# Patient Record
Sex: Female | Born: 1980 | Race: Asian | Hispanic: No | Marital: Married | Smoking: Never smoker
Health system: Southern US, Community
[De-identification: ages and names within clinical notes are randomized; demographics above are authoritative.]

---

## 2020-06-05 ENCOUNTER — Encounter (HOSPITAL_COMMUNITY): Payer: Self-pay

## 2020-06-05 ENCOUNTER — Other Ambulatory Visit: Payer: Self-pay

## 2020-06-05 ENCOUNTER — Ambulatory Visit (HOSPITAL_COMMUNITY)
Admission: EM | Admit: 2020-06-05 | Discharge: 2020-06-05 | Disposition: A | Discharge status: Home / Self Care | Payer: Self-pay | Attending: Urgent Care | Admitting: Urgent Care

## 2020-06-05 DIAGNOSIS — R109 Unspecified abdominal pain: Secondary | ICD-10-CM | POA: Insufficient documentation

## 2020-06-05 LAB — CBC WITH DIFFERENTIAL/PLATELET
Abs Immature Granulocytes: 0.02 10*3/uL (ref 0.00–0.07)
Basophils Absolute: 0 10*3/uL (ref 0.0–0.1)
Basophils Relative: 0 %
Eosinophils Absolute: 0.2 10*3/uL (ref 0.0–0.5)
Eosinophils Relative: 2 %
HCT: 35.8 % — ABNORMAL LOW (ref 36.0–46.0)
Hemoglobin: 11.9 g/dL — ABNORMAL LOW (ref 12.0–15.0)
Immature Granulocytes: 0 %
Lymphocytes Relative: 37 %
Lymphs Abs: 2.6 10*3/uL (ref 0.7–4.0)
MCH: 27.8 pg (ref 26.0–34.0)
MCHC: 33.2 g/dL (ref 30.0–36.0)
MCV: 83.6 fL (ref 80.0–100.0)
Monocytes Absolute: 0.5 10*3/uL (ref 0.1–1.0)
Monocytes Relative: 7 %
Neutro Abs: 3.8 10*3/uL (ref 1.7–7.7)
Neutrophils Relative %: 54 %
Platelets: 246 10*3/uL (ref 150–400)
RBC: 4.28 MIL/uL (ref 3.87–5.11)
RDW: 14.6 % (ref 11.5–15.5)
WBC: 7.1 10*3/uL (ref 4.0–10.5)
nRBC: 0 % (ref 0.0–0.2)

## 2020-06-05 LAB — POCT URINALYSIS DIPSTICK, ED / UC
Bilirubin Urine: NEGATIVE
Glucose, UA: NEGATIVE mg/dL
Hgb urine dipstick: NEGATIVE
Ketones, ur: NEGATIVE mg/dL
Leukocytes,Ua: NEGATIVE
Nitrite: NEGATIVE
Protein, ur: NEGATIVE mg/dL
Specific Gravity, Urine: 1.025 (ref 1.005–1.030)
Urobilinogen, UA: 0.2 mg/dL (ref 0.0–1.0)
pH: 6 (ref 5.0–8.0)

## 2020-06-05 LAB — COMPREHENSIVE METABOLIC PANEL
ALT: 10 U/L (ref 0–44)
AST: 15 U/L (ref 15–41)
Albumin: 3 g/dL — ABNORMAL LOW (ref 3.5–5.0)
Alkaline Phosphatase: 87 U/L (ref 38–126)
Anion gap: 8 (ref 5–15)
BUN: 11 mg/dL (ref 6–20)
CO2: 25 mmol/L (ref 22–32)
Calcium: 8.9 mg/dL (ref 8.9–10.3)
Chloride: 105 mmol/L (ref 98–111)
Creatinine, Ser: 0.77 mg/dL (ref 0.44–1.00)
GFR, Estimated: >60 mL/min (ref >60)
Glucose, Bld: 100 mg/dL — ABNORMAL HIGH (ref 70–99)
Potassium: 3.9 mmol/L (ref 3.5–5.1)
Sodium: 138 mmol/L (ref 135–145)
Total Bilirubin: 0.6 mg/dL (ref 0.3–1.2)
Total Protein: 7.1 g/dL (ref 6.5–8.1)

## 2020-06-05 LAB — LIPASE, BLOOD: Lipase: 31 U/L (ref 11–51)

## 2020-06-05 MED ORDER — NAPROXEN 500 MG PO TABS: 500.0000 mg | ORAL_TABLET | Freq: Two times a day (BID) | ORAL | 0 refills | Status: DC

## 2020-06-05 MED ORDER — OMEPRAZOLE 20 MG PO CPDR: 20.0000 mg | DELAYED_RELEASE_CAPSULE | Freq: Every day | ORAL | 0 refills | Status: DC

## 2020-06-05 NOTE — ED Provider Notes (Signed)
[facility]    CSN: [id number] Arrival date & time: 06/05/20  1853      History   Chief Complaint Chief Complaint  Patient presents with  . Cough  . Headache  . Abdominal Pain    HPI Jacqueline Sullivan is a 40 y.o. female.   Jacqueline Sullivan presents with complaints of left mid abdomen/ flank pain which has been ongoing for the past two days. She has had this intermittently for 2 years now. She moved here 1 [place] ago from [place], her physician there told her she was "inflamed" and provided a medication which would treat the pain. She doesn't know what the medication is. No nausea vomiting diarrhea or constipation. Subjective fevers. Some cough. No sore throat. No ear pain. No other body aches. No urinary symptoms. No skin rash. She doesn't work outside of the home. The pain is constant. No known triggers. Occasional heart burn. She has tried ibuprofen which hasn't helped with pain.    Family with patient providing interpretation per patient request.    ROS per HPI, negative if not otherwise mentioned.      History reviewed. No pertinent past medical history.  There are no problems to display for this patient.   History reviewed. No pertinent surgical history.  OB History   No obstetric history on file.      Home Medications    Prior to Admission medications   Medication Sig Start Date End Date Taking? Authorizing Provider  naproxen (NAPROSYN) 500 MG tablet Take 1 tablet (500 mg total) by mouth 2 (two) times daily. 06/05/20  Yes Linus Mako B, NP  omeprazole (PRILOSEC) 20 MG capsule Take 1 capsule (20 mg total) by mouth daily. 06/05/20  Yes Georgetta Haber, NP    Family History Family History  Problem Relation Age of Onset  . Hypertension Mother     Social History Social History   Tobacco Use  . Smoking status: Never Smoker  . Smokeless tobacco: Never Used  Substance Use Topics  . Alcohol use: Never  . Drug use: Never      Allergies   Patient has no known allergies.   Review of Systems Review of Systems   Physical Exam Triage Vital Signs ED Triage Vitals  Enc Vitals Group     BP 06/05/20 1921 (!) 127/94     Pulse Rate 06/05/20 1919 85     Resp 06/05/20 1919 18     Temp 06/05/20 1921 98.9 F (37.2 C)     Temp Source 06/05/20 1919 Oral     SpO2 06/05/20 1919 100 %     Weight --      Height --      Head Circumference --      Peak Flow --      Pain Score 06/05/20 1915 6     Pain Loc --      Pain Edu? --      Excl. in GC? --    No data found.  Updated Vital Signs BP (!) 127/94 (BP Location: Right Arm)   Pulse 85   Temp 98.9 F (37.2 C) (Oral)   Resp 18   LMP 05/27/2020 (Approximate)   SpO2 100%   Visual Acuity Right Eye Distance:   Left Eye Distance:   Bilateral Distance:    Right Eye Near:   Left Eye Near:    Bilateral Near:     Physical Exam Constitutional:      General: She is not  in acute distress.    Appearance: She is well-developed.  Cardiovascular:     Rate and Rhythm: Normal rate.  Pulmonary:     Effort: Pulmonary effort is normal.  Abdominal:       Comments: Left lateral mid abdomen with tenderness, no LUQ tenderness or LLQ tenderness; no visible rash. No CVA tenderness   Skin:    General: Skin is warm and dry.  Neurological:     Mental Status: She is alert and oriented to person, place, and time.      UC Treatments / Results  Labs (all labs ordered are listed, but only abnormal results are displayed) Labs Reviewed  CBC WITH DIFFERENTIAL/PLATELET  COMPREHENSIVE METABOLIC PANEL  LIPASE, BLOOD  POCT URINALYSIS DIPSTICK, ED / UC    EKG   Radiology No results found.  Procedures Procedures (including critical care time)  Medications Ordered in UC Medications - No data to display  Initial Impression / Assessment and Plan / UC Course  I have reviewed the triage vital signs and the nursing notes.  Pertinent labs & imaging results that  were available during my care of the patient were reviewed by me and considered in my medical decision making (see chart for details).     ua normal. Affected area is more muscular than any underlying organ. No shortness of breath . Vitals stable here. Labs including lipase collected as baseline and per patient requests. nsaids and ppi provided, encouraged to establish with a PCP for recheck and to establish care. Patient verbalized understanding and agreeable to plan.   Final Clinical Impressions(s) / UC Diagnoses   Final diagnoses:  Left flank pain     Discharge Instructions     I will call you if there are any concerning finding with your lab tests.  Naproxen twice a day, take with food, to help with pain. Don't take additional ibuprofen.  Omeprazole daily before meals.  Please establish with a primary care provider as you may need further evaluation if symptoms persist.    ED Prescriptions    Medication Sig Dispense Auth. Provider   naproxen (NAPROSYN) 500 MG tablet Take 1 tablet (500 mg total) by mouth 2 (two) times daily. 30 tablet Linus Mako B, NP   omeprazole (PRILOSEC) 20 MG capsule Take 1 capsule (20 mg total) by mouth daily. 30 capsule Georgetta Haber, NP     PDMP not reviewed this encounter.   Georgetta Haber, NP 06/05/20 2006

## 2020-06-05 NOTE — ED Triage Notes (Signed)
Pt presents with back pain, LLQ pain X 2 years. Pt states she had a PCP in her country. She states she has not had a follow up. Pt has also been having a cough, headache, fever X 2 days. Pt states she has been getting chills.

## 2020-06-05 NOTE — Discharge Instructions (Signed)
I will call you if there are any concerning finding with your lab tests.  Naproxen twice a day, take with food, to help with pain. Don't take additional ibuprofen.  Omeprazole daily before meals.  Please establish with a primary care provider as you may need further evaluation if symptoms persist.

## 2020-06-17 ENCOUNTER — Emergency Department (HOSPITAL_COMMUNITY)
Admission: EM | Admit: 2020-06-17 | Discharge: 2020-06-18 | Disposition: A | Discharge status: Home / Self Care | Payer: Self-pay | Attending: Emergency Medicine | Admitting: Emergency Medicine

## 2020-06-17 ENCOUNTER — Encounter (HOSPITAL_COMMUNITY): Payer: Self-pay

## 2020-06-17 DIAGNOSIS — R1032 Left lower quadrant pain: Secondary | ICD-10-CM | POA: Insufficient documentation

## 2020-06-17 DIAGNOSIS — R109 Unspecified abdominal pain: Secondary | ICD-10-CM

## 2020-06-17 LAB — URINALYSIS, ROUTINE W REFLEX MICROSCOPIC
Bilirubin Urine: NEGATIVE
Glucose, UA: NEGATIVE mg/dL
Hgb urine dipstick: NEGATIVE
Ketones, ur: NEGATIVE mg/dL
Leukocytes,Ua: NEGATIVE
Nitrite: NEGATIVE
Protein, ur: NEGATIVE mg/dL
Specific Gravity, Urine: 1.019 (ref 1.005–1.030)
pH: 5 (ref 5.0–8.0)

## 2020-06-17 NOTE — ED Triage Notes (Signed)
Pt reports that she has L sided flank pain and dysuria for the past few days

## 2020-06-18 ENCOUNTER — Encounter (HOSPITAL_COMMUNITY): Payer: Self-pay | Admitting: Emergency Medicine

## 2020-06-18 MED ORDER — OXYCODONE-ACETAMINOPHEN 5-325 MG PO TABS
1.0000 | ORAL_TABLET | Freq: Once | ORAL | Status: AC
  Administered 2020-06-18: 1 via ORAL
  Filled 2020-06-18: qty 1

## 2020-06-18 NOTE — Progress Notes (Signed)
   06/18/20 0834  TOC ED Mini Assessment  TOC Time spent with patient (minutes): 30  TOC Time saved using PING (minutes): 15  PING Used in TOC Assessment Yes  Admission or Readmission Diverted Yes  Interventions which prevented an admission or readmission Follow-up medical appointment  What brought you to the Emergency Department?  pain  Barriers to Discharge ED Uninsured needing PCP establishment  Barrier interventions appointment at [facility]  Means of departure Car    Kawika Bischoff J. Lucretia Roers, RN, BSN, [place] [phone]  RNCM set up appointment with [facility] on 3/24 @ 0930.  Placed appointment information on After Visit Summary (AVS) advised to please arrive 15 min early and take a picture ID and your current medications.

## 2020-06-18 NOTE — ED Provider Notes (Signed)
[facility] EMERGENCY DEPARTMENT Provider Note   CSN: [id number] Arrival date & time: 06/17/20  1929     History Chief Complaint  Patient presents with  . Flank Pain    Jacqueline Sullivan is a 40 y.o. female.  40 y.o female with a PMH of UC presents to the ED with a chief complaint of left abdominal pain x 1 year. Patient arrived from [place] about a month ago and has not been able to establish PCP care due to financial strain.  Patient was evaluated treated with omeprazole with likely thought to be musculoskeletal pain.  She reports she was diagnosed with ulcerative colitis while in [place], she has been taking the medication which she does not recall the name which was helping with her symptoms, after this medication ran out her symptoms returned. She does report the pain has been ongoing goes from her left side radiating to her back, there is no alleviating or exacerbating factors. Does report waking up "very cold and going to bed feeling very hot". She denies any nausea, vomiting, fever, vaginal bleeding or discharge. Does endorses some burning with urination but no hematuria. No chest pain, no shortness of breath, no other complaints.   The history is provided by the patient. A language interpreter was used (Riem [phone]).         OB History   No obstetric history on file.     Family History  Problem Relation Age of Onset  . Hypertension Mother     Social History   Tobacco Use  . Smoking status: Never Smoker  . Smokeless tobacco: Never Used  Substance Use Topics  . Alcohol use: Never  . Drug use: Never    Home Medications Prior to Admission medications   Medication Sig Start Date End Date Taking? Authorizing Provider  naproxen (NAPROSYN) 500 MG tablet Take 1 tablet (500 mg total) by mouth 2 (two) times daily. 06/05/20   Georgetta Haber, NP  omeprazole (PRILOSEC) 20 MG capsule Take 1 capsule (20 mg total) by mouth daily. 06/05/20   Georgetta Haber,  NP    Allergies    Patient has no known allergies.  Review of Systems   Review of Systems  Constitutional: Positive for chills. Negative for fever.  Respiratory: Negative for shortness of breath.   Cardiovascular: Negative for chest pain and leg swelling.  Gastrointestinal: Positive for abdominal pain. Negative for nausea and vomiting.  Genitourinary: Positive for flank pain.  Musculoskeletal: Negative for back pain.  Skin: Negative for pallor and wound.  Neurological: Negative for light-headedness, numbness and headaches.  All other systems reviewed and are negative.   Physical Exam Updated Vital Signs BP (!) 146/96 (BP Location: Left Arm)   Pulse 60   Temp 98.5 F (36.9 C) (Oral)   Resp 16   LMP 05/27/2020 (Approximate)   SpO2 100%   Physical Exam Vitals and nursing note reviewed.  Constitutional:      Comments: Non ill, non toxic appearing.   HENT:     Head: Normocephalic and atraumatic.     Mouth/Throat:     Mouth: Mucous membranes are moist.  Cardiovascular:     Rate and Rhythm: Normal rate.  Pulmonary:     Effort: Pulmonary effort is normal.     Breath sounds: No wheezing or rales.  Abdominal:     General: Abdomen is flat.     Palpations: Abdomen is soft.     Tenderness: There is no abdominal tenderness. There  is no right CVA tenderness or left CVA tenderness.     Comments: Bowel sounds present, soft, non tender abdomen.   Musculoskeletal:        General: No tenderness, deformity or signs of injury.     Cervical back: Normal range of motion and neck supple.  Skin:    General: Skin is warm and dry.  Neurological:     Mental Status: She is alert and oriented to person, place, and time.     ED Results / Procedures / Treatments   Labs (all labs ordered are listed, but only abnormal results are displayed) Labs Reviewed  URINALYSIS, ROUTINE W REFLEX MICROSCOPIC  POC URINE PREG, ED    EKG None  Radiology No results found.  Procedures Procedures    Medications Ordered in ED Medications  oxyCODONE-acetaminophen (PERCOCET/ROXICET) 5-325 MG per tablet 1 tablet (has no administration in time range)    ED Course  I have reviewed the triage vital signs and the nursing notes.  Pertinent labs & imaging results that were available during my care of the patient were reviewed by me and considered in my medical decision making (see chart for details).    MDM Rules/Calculators/A&P     Language line utilized for this encounter Riem [phone]   Patient here with left flank pain reports she arrived here from [place] about a month ago, was treated for ulcerative colitis while there, was taken a medication which recall the name of the medication but was medication ran out she returned symptoms worsen.  She was evaluated at urgent care, prescription for naproxen, omeprazole thought that this was likely being caused by symptoms.  She does report there urinary symptoms at this time, such as dysuria.   During primary evaluation, patient is nontoxic, nonill stretcher in the hallway.  She denies any nausea, vomiting, fevers.  She does report hot flashes while in bed.  We discussed appropriate follow-up with primary care in order to obtain a ulcerative colitis treatment.  I placed a call for social work in order to have patient followed by them along with schedule an appointment with the [facility].  Her abdomen is soft, nontender to palpation, there is no nausea, vomiting, vitals are stable.  This was discussed at length with patient along with interpreter.  Patient was able to have an appointment scheduled for PCP care, she was given percocet while in the ED in order to help with her pain but will not go home on narcotic medication. She is agreable to PCP follow up. Return precautions discussed at length.   Portions of this note were generated with Scientist, clinical (histocompatibility and immunogenetics). Dictation errors may occur despite best attempts at proofreading.   Final Clinical Impression(s) / ED Diagnoses Final diagnoses:  Left flank pain  Left lower quadrant abdominal pain    Rx / DC Orders ED Discharge Orders         Ordered    Ambulatory referral to Gastroenterology        06/18/20 0818           Claude Manges, PA-C 06/18/20 0854    Virgina Norfolk, DO 06/18/20 [phone]

## 2020-06-18 NOTE — Discharge Instructions (Addendum)
  [facility] on 3/24 @ 0930.  Placed appointment information on After Visit Summary (AVS) advised to please arrive 15 min early and take a picture ID and your current medications.

## 2020-06-18 NOTE — ED Notes (Signed)
Arabic interpreter used via the ipad to update patient. Patient requesting pain meds. Will let provider know. Indicates her pain a year ago was evaluated and treated in [place] for IBS and the same pain is back.

## 2020-07-18 ENCOUNTER — Ambulatory Visit: Payer: Medicaid Other | Attending: Family Medicine | Admitting: Family Medicine

## 2020-07-18 ENCOUNTER — Encounter: Payer: Self-pay | Admitting: Family Medicine

## 2020-07-18 ENCOUNTER — Other Ambulatory Visit: Payer: Self-pay

## 2020-07-18 VITALS — BP 127/83 | HR 68 | Resp 19 | Ht 70.0 in | Wt 184.0 lb

## 2020-07-18 DIAGNOSIS — K529 Noninfective gastroenteritis and colitis, unspecified: Secondary | ICD-10-CM | POA: Diagnosis not present

## 2020-07-18 DIAGNOSIS — K219 Gastro-esophageal reflux disease without esophagitis: Secondary | ICD-10-CM

## 2020-07-18 DIAGNOSIS — R232 Flushing: Secondary | ICD-10-CM

## 2020-07-18 DIAGNOSIS — A09 Infectious gastroenteritis and colitis, unspecified: Secondary | ICD-10-CM

## 2020-07-18 MED ORDER — OMEPRAZOLE 20 MG PO CPDR: 20.0000 mg | DELAYED_RELEASE_CAPSULE | Freq: Every day | ORAL | 3 refills | Status: DC

## 2020-07-18 MED ORDER — CLONIDINE HCL 0.1 MG PO TABS: 0.1000 mg | ORAL_TABLET | Freq: Every evening | ORAL | 3 refills | Status: DC | PRN

## 2020-07-18 NOTE — Progress Notes (Signed)
Subjective:  Patient ID: Jacqueline Sullivan, female    DOB: 1981/03/20  Age: 40 y.o. MRN: [id number]  CC: Hospitalization Follow-up   HPI Jacqueline Sullivan is a 40 year old female who relocated to the [place] from [place] in 04/2020 and presents today to establish care.  She is accompanied by an Arabic interpreter.  She had UC visits on 06/05/2020 and again on 06/17/2020 for left flank pain.  She was placed on naproxen and omeprazole.  Labs were unremarkable and urine negative for UTI. Complains of a 5 year history of L side pain worse on ingesting spicy foods but does not radiate to her groin. Pain is associated with 'warm sensation' and numbness in her hands. Also complains of heart burn which she states is uncontrolled with the PPI she was prescribed at the ED. No n/v/d.  Urgent care notes she did have a history of ulcerative colitis while in [place] and was on medications.  The patient is not able to give me any additional information regarding ulcerative colitis.  She also complains of alternating hot and cold sensations which occur at night along with night sweats. History reviewed. No pertinent past medical history.  History reviewed. No pertinent surgical history.  Family History  Problem Relation Age of Onset  . Hypertension Mother     No Known Allergies  Outpatient Medications Prior to Visit  Medication Sig Dispense Refill  . naproxen (NAPROSYN) 500 MG tablet Take 1 tablet (500 mg total) by mouth 2 (two) times daily. 30 tablet 0  . omeprazole (PRILOSEC) 20 MG capsule Take 1 capsule (20 mg total) by mouth daily. 30 capsule 0   No facility-administered medications prior to visit.     ROS Review of Systems  Constitutional: Negative for activity change, appetite change and fatigue.  HENT: Negative for congestion, sinus pressure and sore throat.   Eyes: Negative for visual disturbance.  Respiratory: Negative for cough, chest tightness, shortness of breath and wheezing.    Cardiovascular: Negative for chest pain and palpitations.  Gastrointestinal: Negative for abdominal distention, abdominal pain and constipation.  Endocrine: Negative for polydipsia.  Genitourinary: Negative for dysuria and frequency.  Musculoskeletal: Negative for arthralgias and back pain.  Skin: Negative for rash.  Neurological: Negative for tremors, light-headedness and numbness.  Hematological: Does not bruise/bleed easily.  Psychiatric/Behavioral: Negative for agitation and behavioral problems.    Objective:  BP 127/83   Pulse 68   Resp 19   Ht 5\' 10"  (1.778 m)   Wt 184 lb (83.5 kg)   SpO2 98%   BMI 26.40 kg/m   BP/Weight 07/18/2020 06/18/2020 06/05/2020  Systolic BP 127 146 127  Diastolic BP 83 96 94  Wt. (Lbs) 184 - -  BMI 26.4 - -      Physical Exam Constitutional:      Appearance: She is well-developed.  Neck:     Vascular: No JVD.  Cardiovascular:     Rate and Rhythm: Normal rate.     Heart sounds: Normal heart sounds. No murmur heard.   Pulmonary:     Effort: Pulmonary effort is normal.     Breath sounds: Normal breath sounds. No wheezing or rales.  Chest:     Chest wall: No tenderness.  Abdominal:     General: Bowel sounds are normal. There is no distension.     Palpations: Abdomen is soft. There is no mass.     Tenderness: There is no abdominal tenderness. There is no right CVA tenderness or left CVA  tenderness.  Musculoskeletal:        General: Normal range of motion.     Right lower leg: No edema.     Left lower leg: No edema.  Neurological:     Mental Status: She is alert and oriented to person, place, and time.  Psychiatric:        Mood and Affect: Mood normal.     CMP Latest Ref Rng & Units 06/05/2020  Glucose 70 - 99 mg/dL 191(Y)  BUN 6 - 20 mg/dL 11  Creatinine 7.82 - 9.56 mg/dL 2.13  Sodium 086 - 578 mmol/L 138  Potassium 3.5 - 5.1 mmol/L 3.9  Chloride 98 - 111 mmol/L 105  CO2 22 - 32 mmol/L 25  Calcium 8.9 - 10.3 mg/dL 8.9  Total  Protein 6.5 - 8.1 g/dL 7.1  Total Bilirubin 0.3 - 1.2 mg/dL 0.6  Alkaline Phos 38 - 126 U/L 87  AST 15 - 41 U/L 15  ALT 0 - 44 U/L 10    Lipid Panel  No results found for: CHOL, TRIG, HDL, CHOLHDL, VLDL, LDLCALC, LDLDIRECT  CBC    Component Value Date/Time   WBC 7.1 06/05/2020 1954   RBC 4.28 06/05/2020 1954   HGB 11.9 (L) 06/05/2020 1954   HCT 35.8 (L) 06/05/2020 1954   PLT 246 06/05/2020 1954   MCV 83.6 06/05/2020 1954   MCH 27.8 06/05/2020 1954   MCHC 33.2 06/05/2020 1954   RDW 14.6 06/05/2020 1954   LYMPHSABS 2.6 06/05/2020 1954   MONOABS 0.5 06/05/2020 1954   EOSABS 0.2 06/05/2020 1954   BASOSABS 0.0 06/05/2020 1954    No results found for: HGBA1C  Assessment & Plan:  1. Colitis Questionable history of ulcerative colitis; patient is unsure of this but notes reflect that she had this in the past We will proceed with imaging - CT Abdomen Pelvis W Contrast; Future  2. Hot flashes - cloNIDine (CATAPRES) 0.1 MG tablet; Take 1 tablet (0.1 mg total) by mouth at bedtime as needed. For hot flashes  Dispense: 30 tablet; Refill: 3  3. Infectious gastroenteritis and colitis - CT Abdomen Pelvis W Contrast; Future  4. Gastroesophageal reflux disease without esophagitis Advised to avoid foods that trigger this. - omeprazole (PRILOSEC) 20 MG capsule; Take 1 capsule (20 mg total) by mouth daily.  Dispense: 30 capsule; Refill: 3   Meds ordered this encounter  Medications  . omeprazole (PRILOSEC) 20 MG capsule    Sig: Take 1 capsule (20 mg total) by mouth daily.    Dispense:  30 capsule    Refill:  3  . cloNIDine (CATAPRES) 0.1 MG tablet    Sig: Take 1 tablet (0.1 mg total) by mouth at bedtime as needed. For hot flashes    Dispense:  30 tablet    Refill:  3    Follow-up: Return in about 3 months (around 10/18/2020) for chronic disease management.       Hoy Register, MD, FAAFP. [facility] [place], [place] [phone]    07/18/2020, 12:55 PM

## 2020-07-18 NOTE — Patient Instructions (Signed)
Gastroesophageal Reflux Disease, Adult  Gastroesophageal reflux (GER) happens when acid from the stomach flows up into the tube that connects the mouth and the stomach (esophagus). Normally, food travels down the esophagus and stays in the stomach to be digested. With GER, food and stomach acid sometimes move back up into the esophagus. You may have a disease called gastroesophageal reflux disease (GERD) if the reflux:  Happens often.  Causes frequent or very bad symptoms.  Causes problems such as damage to the esophagus. When this happens, the esophagus becomes sore and swollen. Over time, GERD can make small holes (ulcers) in the lining of the esophagus. What are the causes? This condition is caused by a problem with the muscle between the esophagus and the stomach. When this muscle is weak or not normal, it does not close properly to keep food and acid from coming back up from the stomach. The muscle can be weak because of:  Tobacco use.  Pregnancy.  Having a certain type of hernia (hiatal hernia).  Alcohol use.  Certain foods and drinks, such as coffee, chocolate, onions, and peppermint. What increases the risk?  Being overweight.  Having a disease that affects your connective tissue.  Taking NSAIDs, such a ibuprofen. What are the signs or symptoms?  Heartburn.  Difficult or painful swallowing.  The feeling of having a lump in the throat.  A bitter taste in the mouth.  Bad breath.  Having a lot of saliva.  Having an upset or bloated stomach.  Burping.  Chest pain. Different conditions can cause chest pain. Make sure you see your doctor if you have chest pain.  Shortness of breath or wheezing.  A long-term cough or a cough at night.  Wearing away of the surface of teeth (tooth enamel).  Weight loss. How is this treated?  Making changes to your diet.  Taking medicine.  Having surgery. Treatment will depend on how bad your symptoms are. Follow these  instructions at home: Eating and drinking  Follow a diet as told by your doctor. You may need to avoid foods and drinks such as: ? Coffee and tea, with or without caffeine. ? Drinks that contain alcohol. ? Energy drinks and sports drinks. ? Bubbly (carbonated) drinks or sodas. ? Chocolate and cocoa. ? Peppermint and mint flavorings. ? Garlic and onions. ? Horseradish. ? Spicy and acidic foods. These include peppers, chili powder, curry powder, vinegar, hot sauces, and BBQ sauce. ? Citrus fruit juices and citrus fruits, such as oranges, lemons, and limes. ? Tomato-based foods. These include red sauce, chili, salsa, and pizza with red sauce. ? Fried and fatty foods. These include donuts, french fries, potato chips, and high-fat dressings. ? High-fat meats. These include hot dogs, rib eye steak, sausage, ham, and bacon. ? High-fat dairy items, such as whole milk, butter, and cream cheese.  Eat small meals often. Avoid eating large meals.  Avoid drinking large amounts of liquid with your meals.  Avoid eating meals during the 2-3 hours before bedtime.  Avoid lying down right after you eat.  Do not exercise right after you eat.   Lifestyle  Do not smoke or use any products that contain nicotine or tobacco. If you need help quitting, ask your doctor.  Try to lower your stress. If you need help doing this, ask your doctor.  If you are overweight, lose an amount of weight that is healthy for you. Ask your doctor about a safe weight loss goal.   General instructions    Pay attention to any changes in your symptoms.  Take over-the-counter and prescription medicines only as told by your doctor.  Do not take aspirin, ibuprofen, or other NSAIDs unless your doctor says it is okay.  Wear loose clothes. Do not wear anything tight around your waist.  Raise (elevate) the head of your bed about 6 inches (15 cm). You may need to use a wedge to do this.  Avoid bending over if this makes your  symptoms worse.  Keep all follow-up visits. Contact a doctor if:  You have new symptoms.  You lose weight and you do not know why.  You have trouble swallowing or it hurts to swallow.  You have wheezing or a cough that keeps happening.  You have a hoarse voice.  Your symptoms do not get better with treatment. Get help right away if:  You have sudden pain in your arms, neck, jaw, teeth, or back.  You suddenly feel sweaty, dizzy, or light-headed.  You have chest pain or shortness of breath.  You vomit and the vomit is green, yellow, or black, or it looks like blood or coffee grounds.  You faint.  Your poop (stool) is red, bloody, or black.  You cannot swallow, drink, or eat. These symptoms may represent a serious problem that is an emergency. Do not wait to see if the symptoms will go away. Get medical help right away. Call your local emergency services (911 in the [place] Do not drive yourself to the hospital. Summary  If a person has gastroesophageal reflux disease (GERD), food and stomach acid move back up into the esophagus and cause symptoms or problems such as damage to the esophagus.  Treatment will depend on how bad your symptoms are.  Follow a diet as told by your doctor.  Take all medicines only as told by your doctor. This information is not intended to replace advice given to you by your health care provider. Make sure you discuss any questions you have with your health care provider. Document Revised: 10/23/2019 Document Reviewed: 10/23/2019 Elsevier Patient Education  2021 [employer] Inc.  

## 2020-07-29 ENCOUNTER — Telehealth: Payer: Self-pay

## 2020-07-29 NOTE — Telephone Encounter (Signed)
The PA has been started but her insurance has not approved the request at this time.

## 2020-07-29 NOTE — Telephone Encounter (Signed)
-----   Message from Orpah Clinton sent at 07/29/2020  2:38 PM EDT ----- Regarding: AUTHORIZATION Hello,    Patient Jacqueline Sullivan required auth through [facility] for Ct Abdomen and Pelvis with contrast.     Thanks Enrique Sack

## 2020-07-31 ENCOUNTER — Ambulatory Visit (HOSPITAL_COMMUNITY): Payer: Medicaid Other

## 2020-08-05 NOTE — Telephone Encounter (Signed)
See encounter

## 2020-08-05 NOTE — Telephone Encounter (Signed)
Tobi Bastos calling from [employer] is calling regarding PA for CT abodmen and Pelvis. Wanting to know the address for the procedure because it is not matching with the PA an that is holding up the PA. CB- [id number] X [phone]

## 2020-08-06 NOTE — Telephone Encounter (Signed)
PA has been done and appointment has been rescheduled and patient has been left a VM informing her to contact office for appointment details.

## 2020-08-07 ENCOUNTER — Ambulatory Visit (HOSPITAL_COMMUNITY): Payer: Medicaid Other

## 2020-08-07 ENCOUNTER — Other Ambulatory Visit (HOSPITAL_COMMUNITY): Payer: Medicaid Other

## 2020-08-07 ENCOUNTER — Encounter (HOSPITAL_COMMUNITY): Payer: Self-pay

## 2020-08-12 ENCOUNTER — Ambulatory Visit (HOSPITAL_COMMUNITY)
Admission: RE | Admit: 2020-08-12 | Discharge: 2020-08-12 | Disposition: A | Discharge status: Home / Self Care | Payer: Medicaid Other | Source: Ambulatory Visit | Attending: Family Medicine | Admitting: Family Medicine

## 2020-08-12 ENCOUNTER — Other Ambulatory Visit: Payer: Self-pay

## 2020-08-12 ENCOUNTER — Encounter (HOSPITAL_COMMUNITY): Payer: Self-pay

## 2020-08-12 DIAGNOSIS — K529 Noninfective gastroenteritis and colitis, unspecified: Secondary | ICD-10-CM | POA: Diagnosis present

## 2020-08-12 DIAGNOSIS — A09 Infectious gastroenteritis and colitis, unspecified: Secondary | ICD-10-CM | POA: Diagnosis present

## 2020-08-12 MED ORDER — IOHEXOL 300 MG/ML  SOLN
100.0000 mL | Freq: Once | INTRAMUSCULAR | Status: AC | PRN
  Administered 2020-08-12: 100 mL via INTRAVENOUS

## 2020-08-13 ENCOUNTER — Ambulatory Visit (HOSPITAL_COMMUNITY): Payer: Medicaid Other

## 2020-08-15 ENCOUNTER — Telehealth: Payer: Self-pay

## 2020-08-15 NOTE — Telephone Encounter (Signed)
Attempted to call pt no answer left VM to return call.  

## 2020-08-15 NOTE — Telephone Encounter (Signed)
-----   Message from Hoy Register, MD sent at 08/15/2020  2:03 PM EDT ----- Please inform her that CT of the abdomen does not reveal evidence of colitis or source of abdominal or pelvic problem.

## 2020-08-16 NOTE — Telephone Encounter (Signed)
-----   Message from Enobong Newlin, MD sent at 08/15/2020  2:03 PM EDT ----- Please inform her that CT of the abdomen does not reveal evidence of colitis or source of abdominal or pelvic problem. 

## 2020-08-16 NOTE — Telephone Encounter (Signed)
Pt of informed of CT results verbalized understanding no other concerns voiced.

## 2020-08-19 ENCOUNTER — Other Ambulatory Visit: Payer: Self-pay | Admitting: Obstetrics and Gynecology

## 2020-08-19 ENCOUNTER — Ambulatory Visit
Admission: RE | Admit: 2020-08-19 | Discharge: 2020-08-19 | Disposition: A | Discharge status: Home / Self Care | Payer: Medicaid Other | Source: Ambulatory Visit | Attending: Obstetrics and Gynecology | Admitting: Obstetrics and Gynecology

## 2020-08-19 DIAGNOSIS — R9389 Abnormal findings on diagnostic imaging of other specified body structures: Secondary | ICD-10-CM

## 2020-09-17 NOTE — Congregational Nurse Program (Signed)
Patient came in with headache and requested blood pressure check. 141/88. Educated patient about blood pressure ranges and target BP. Discussed patient's eating and exercise habits and educated patient to reduce salt, avoid processed and fast foods, and increase exercise prior to MD appointment. Encouraged patient to recheck blood pressure with CN staff next week and to bring up elevated blood pressure with MD at next appointment on June 28th. Confirmed date and location of that appointment with patient. Patient verbalized plan with teach back.   Arman Bogus RN BSn PCCN  [facility] [phone]-cell [phone]-office

## 2020-10-22 ENCOUNTER — Ambulatory Visit: Payer: Self-pay | Attending: Family Medicine | Admitting: Family Medicine

## 2020-10-22 ENCOUNTER — Encounter: Payer: Self-pay | Admitting: Family Medicine

## 2020-10-22 ENCOUNTER — Other Ambulatory Visit: Payer: Self-pay

## 2020-10-22 DIAGNOSIS — Z227 Latent tuberculosis: Secondary | ICD-10-CM

## 2020-10-22 DIAGNOSIS — T371X5A Adverse effect of antimycobacterial drugs, initial encounter: Secondary | ICD-10-CM

## 2020-10-22 DIAGNOSIS — K219 Gastro-esophageal reflux disease without esophagitis: Secondary | ICD-10-CM

## 2020-10-22 DIAGNOSIS — G620 Drug-induced polyneuropathy: Secondary | ICD-10-CM

## 2020-10-22 HISTORY — DX: Gastro-esophageal reflux disease without esophagitis: K21.9

## 2020-10-22 NOTE — Progress Notes (Signed)
   Virtual Visit via Telephone Note  I connected with Jacqueline Sullivan, on 10/22/2020 at 11:05 AM by telephone due to the COVID-19 pandemic and verified that I am speaking with the correct person using two identifiers.   Consent: I discussed the limitations, risks, security and privacy concerns of performing an evaluation and management service by telephone and the availability of in person appointments. I also discussed with the patient that there may be a patient responsible charge related to this service. The patient expressed understanding and agreed to proceed.   Location of Patient: [place] [facility] Provider: Clinic   Persons participating in Telemedicine visit: Roselind Messier Winnie Interpreter Spouse - Ramaddan Dr. 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 At her last visit she was commenced on clonidine for hot flashes. States she had to stop all her medications due to being on TB medications. Management of TB was performed by the health department but she was only treated for 1 month per patient 'as her body could not tolerate the medications'. She currently does not take any medications at the moment. She has fatigue, numbness which she states have improved ever since her tuberculous regimen was stopped.    Past Medical History:  Diagnosis Date   Gastroesophageal reflux disease without esophagitis 10/22/2020   No Known Allergies  Current Outpatient Medications on File Prior to Visit  Medication Sig Dispense Refill   cloNIDine (CATAPRES) 0.1 MG tablet Take 1 tablet (0.1 mg total) by mouth at bedtime as needed. For hot flashes 30 tablet 3   naproxen (NAPROSYN) 500 MG tablet Take 1 tablet (500 mg total) by mouth 2 (two) times daily. 30 tablet 0   omeprazole (PRILOSEC) 20 MG capsule Take 1 capsule (20 mg total) by mouth daily. 30 capsule 3   No  current facility-administered medications on file prior to visit.    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 Gastroesophageal reflux disease without esophagitis Improved ever since PPI was initiated She no longer needs her PPI.  3.  Latent TB Currently of treatment due to patient intolerance Advised her to follow-up with the health department to ensure adequate treatment  Follow Up Instructions: Return if symptoms worsen or fail to improve.    I discussed the assessment and treatment plan with the patient. The patient was provided an opportunity to ask questions and all were answered. The patient agreed with the plan and demonstrated an understanding of the instructions.   The patient was advised to call back or seek an in-person evaluation if the symptoms worsen or if the condition fails to improve as anticipated.     I provided 12 minutes total of non-face-to-face time during this encounter.   Hoy Register, MD, FAAFP. [facility] [place], [place] [phone]   10/22/2020, 11:05 AM

## 2020-11-12 ENCOUNTER — Encounter (INDEPENDENT_AMBULATORY_CARE_PROVIDER_SITE_OTHER): Payer: Self-pay

## 2020-11-12 ENCOUNTER — Telehealth: Payer: Self-pay | Admitting: Family Medicine

## 2020-11-12 NOTE — Telephone Encounter (Signed)
Patient came by asking to get a referral to the GI specialist. Please advise

## 2020-11-20 NOTE — Telephone Encounter (Signed)
Pt was called and was informed to contact [facility] to set an appointment referral was placed previous.

## 2021-03-04 ENCOUNTER — Other Ambulatory Visit: Payer: Self-pay

## 2021-03-04 ENCOUNTER — Encounter: Payer: Self-pay | Admitting: Physician Assistant

## 2021-03-04 ENCOUNTER — Ambulatory Visit (HOSPITAL_COMMUNITY)
Admission: RE | Admit: 2021-03-04 | Discharge: 2021-03-04 | Disposition: A | Discharge status: Home / Self Care | Payer: Medicaid Other | Source: Ambulatory Visit | Attending: Physician Assistant | Admitting: Physician Assistant

## 2021-03-04 ENCOUNTER — Ambulatory Visit: Payer: Medicaid Other | Attending: Physician Assistant | Admitting: Physician Assistant

## 2021-03-04 VITALS — BP 129/87 | HR 71 | Temp 98.8°F | Resp 18 | Ht 70.0 in | Wt 198.0 lb

## 2021-03-04 DIAGNOSIS — F32A Depression, unspecified: Secondary | ICD-10-CM

## 2021-03-04 DIAGNOSIS — M546 Pain in thoracic spine: Secondary | ICD-10-CM | POA: Insufficient documentation

## 2021-03-04 DIAGNOSIS — M549 Dorsalgia, unspecified: Secondary | ICD-10-CM | POA: Diagnosis present

## 2021-03-04 DIAGNOSIS — M542 Cervicalgia: Secondary | ICD-10-CM

## 2021-03-04 DIAGNOSIS — Z227 Latent tuberculosis: Secondary | ICD-10-CM

## 2021-03-04 DIAGNOSIS — Z6828 Body mass index (BMI) 28.0-28.9, adult: Secondary | ICD-10-CM

## 2021-03-04 DIAGNOSIS — Z3202 Encounter for pregnancy test, result negative: Secondary | ICD-10-CM

## 2021-03-04 LAB — POCT URINE PREGNANCY: Preg Test, Ur: NEGATIVE

## 2021-03-04 MED ORDER — CYCLOBENZAPRINE HCL 10 MG PO TABS: 10.0000 mg | ORAL_TABLET | Freq: Three times a day (TID) | ORAL | 0 refills | Status: DC | PRN

## 2021-03-04 NOTE — Patient Instructions (Signed)
??? ????? ????? ??? ???????? Acute Back Pain, Adult ???? ????? ?????? ???? ??????? ??? ???? ???? ????? ?????. ??????? ?? ???? ???? ????? ?? ????? ?????? ???????. ????? ???? ??? ???????:  ???? ??? ??????? ?? ??????? ???? ?? ?????? ?? ?????. ???????? ?? ??????? ???? ???? ?????? ??????. ???? ?? ???? ??? ??????? ?????? ??? ????? ??? ?? ?? ?????.  ???? (?????) ????? ?????? ?????? ???????. ?????? ????? ?????? ?????? ?? ???? ?????? ????? ??? ???????? ??? ???? (?????) ?????? ??????.  ????? ??????? ??? ???? ?? ????? ?????? ??????? ?? ??????? ????????.  ???? ???? ??? ?????.  ?????? ???????. ?? ????? ?? ??? ???? ????? ?????? ????? ??????? ?????? ??? ??????. ????? ?? ???? ???? ????? ?????? ?? ?????? ???????? ?? ??????. ???? ????????? ??????? ?? ??????: ??????? ??? ????? ??????? ???????  ????? ??????? ??? ??????? ???? ??????? ??????? ????? ????? ????? ???? ?? ??? ???? ????. ?? ???? ??????? ?????? ????? ??????? ?????? ???? ?? ???? ???? ?? ????? ??? ????? ?? ????? ????? ???????.  ?? ???? ???? ??????? ?????? ???? ????? ??? ???? ????? ?? ????? ??? 24-48 ???? ?? ????? ?????. ????? ??? ?????? ??????? ???????: ? ??? ????? ?? ??? ???????. ? ?? ????? ??? ???? ??????. ? ???? ????? ???? 20 ?????? ???? ??? ????? 2-3 ???? ??????. ? ??? ????? ??? ???? ??? ????? ??? ?????? ??????. ???? ??? ??? ????. ???? ??? ?? ?????? ?????? ?????? ?? ??????? ?? ???????? ???? ???? ???? ???? ????? ????? ?? ?????? ???? ??? ???? ?????.  ??? ??????? ??? ??????? ??????? ??? ??? ?????? ???? ????? ?? ???? ??????? ??????? ??? ????? ??????? ????. ?????? ???? ??????? ???? ????? ?? ????? ??????? ?????? ??? ?????? ??????? ?? ????? ???????. ? ???? ?? ????? ??? ????? ????? ???????. ? ???? ???? ??????? ??? ?????? ???? ?????? ?? 20-30 ?????. ? ??? ????? ??? ???? ??? ????? ??? ????? ?????? ??????. ???? ???? ??? ?????? ??? ??? ????? ???? ?????? ?????? ?? ??????? ?? ???????. ??? ?????? ??? ??????? ????. ??????   ?? ???? ?? ??????. ??????? ?? ?????? ?????  ?? ??? ?? ????? ?? ???? ??????.  ???? ??? ?? ??? ??????. ???? ??????? ?????? ????? ???? ?????? ?? ????? ????? ??? ??????. ? ??? ???? ??? ????? ????? ?????? ??? ??? ?? ???? ???????. ???? ???? ?????. ???? ????? ?????? ??? ?????? ?? ??? ?????? ?????? 90 ???? (????? ?????). ? ???? ?? ???? ??? ??????? ?? ???? ??????? ??? ????? ???????. ?? ????? ?????? ????? ????? ????? (??????? ????????) ??? ???? ???????? ??? ??? ?????.  ??? ?????? ??? ???? ?????? ????? ????? ??? ???. ???? ????? ??? ??? ????? ?? ???.  ?? ???? ?? ??? ?? ??? ?? ???? ????? ????? ?? 30 ????? ?? ????? ???????. ??????? ?? ?????? ?????? ????? ?? ????? ???? ?? ???? ????? ??? ????.  ?? ??? ??????? ?? ?????? ??????? ????? ??? ??? ????? ??????? ??????? ????? ???????? ?? ??? ??????.  ???? ???????? ??????? ??? ??? ???????. ??? ??? ????? ???? ??? ???????? ???? ????? ????? ?????? ?? ????? ??? ????: ? ??? ??????. ? ???? ????? ?????? ?? ????. ? ???? ????????.  ???? ???? ??????? ??????? ??? ??????? ???? ??????? ??????. ???????? ????? ?? ???? ????? ????? ???????? ?? ?????? ????? ??????? ??? ??? ??????? ????????.  ????? ??????? ???? ????? ?????? ??? ???? ?????? ????? ??? ??? ?????? ???? ??????? ?????? ??????? ??. ??? ????? ?????? ??????? ??? ???? ??? ??????? ??????? ?????? ??????? ??????? ??. ??? ??????  ???? ??? ???? ?? ?????? ??????. ???? ??? ????? ?????? ?????? ????? ??? ???? ????? ?? ????? ?????? ??? ????? ??????.  ???? ??????? ?? ??????? ???? ????? ???? ?????? ?? ?????. ????? ????? ?????? ?????? ?? ????? ???? ??????? ????? ?? ???? ??? ????? ?????. ???? ??? ??????? ??? ????? ??????? ??? ?????? ???????. ??????? ????  ????? ?????? ?? ??? ???? ??? ????? ????. ???? ????? ??? ???? ?? ??? ???????? ??????. ??? ??????? ??? ????? ??? ????? ???? ??????.  ???? ??? ??????? ???? ?????? ?? ?????? ?????? (????? ????????) ??? ??????? ??????? ??????????? ??? ??????? ?????? ?? ??????? ???????. ????? ??? ?????? ??????? ???????: ? ??? ????? ????? ?? ????? ?????? ?????  ???? ????? ?? ??? ???? ?? ????? ????? ?????. ? ??? ?????? ????? ?? ?????? ?????? ??? ????? ???? ?? ????? ????? ??? ??????.  ???? ??? ????? ??? ??????? ???? ??????? ??????. ????? ???? ???: ? ?????? ??????? ?? ???????. ? ?????? ?????? ?????? ?? ???????. ? ?????? ?????? ?? ??????. ???? ?????? ??????? ?????? ?? ??????? ???????:  ?????? ???? ?? ??? ?? ?????? ?? ??????.  ?????? ???? ?????? ???? ?????? ??? ????? ?? ???????.  ??? ???? ????? ??? ???????.  ??????? ?????? ?? ??????.  ??? ???? ????? ??? ?????? ???.  ?????? ???? ?? ???????.  ??????? ?????? ?? ???.  ????? ???? ?? ?????. ???? ???????? ??? ????? ?? ??????? ???????:  ???? ????? ????? ?? ??????? ?? ???????.  ?????? ?????? ?? ??? ??? ??????? ?? ???????? ?? ???????.  ?????? ??????. ???? ???? ??? ??????? ????? ????? ????? ???? ???? ?????. ??? ?? ????? ??? ?? ???? ???????. ?? ???? ???????? ?????? ??? ?????. ???? ?????? ??????? ??????? (  911 ?? ???????? ??????? ?????????). ?? ??? ??????? ????? ??? ????????. ????  ???? ????? ?????? ???? ??????? ??? ???? ???? ????? ?????.  ???? ???????? ??????? ??? ??? ???????. ??? ??? ????? ???? ??? ???????? ???? ????? ????? ?????? ?? ????? ??? ????.  ????? ??????? ?? ???????? ??? ??????? ????? ??????? ?????? ??? ????? ????? ????? ???? ?? ??? ???? ????. ??? ????? ?? ??? ????????? ?? ???? ?????? ????????? ???? ?????? ???? ??????? ??????. ???? ?? ?????? ??? ????? ???? ?? ???? ?? ???? ??????? ??????.? Document Revised: 07/31/2020 Document Reviewed: 07/31/2020 Elsevier Patient Education  2022 [employer].
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
## 2021-03-06 ENCOUNTER — Telehealth: Payer: Self-pay | Admitting: *Deleted

## 2021-03-06 DIAGNOSIS — F32A Depression, unspecified: Secondary | ICD-10-CM | POA: Insufficient documentation

## 2021-03-06 DIAGNOSIS — Z227 Latent tuberculosis: Secondary | ICD-10-CM | POA: Insufficient documentation

## 2021-03-06 NOTE — Telephone Encounter (Signed)
-----   Message from Roney Jaffe, [place] sent at 03/06/2021  7:31 AM EST ----- Please call patient and let her know that the x-rays of her neck and back did not show any abnormalities.  Continue with prescribed regimen, if pain continues, we will start a referral for her to be seen by orthopedics for further evaluation.

## 2021-03-06 NOTE — Telephone Encounter (Signed)
Medical Assistant used [employer] to contact patient.  Interpreter Name: Maximiano Coss #: [id number] Patient was not available, [employer] left patient a voicemail. Voicemail states to give a call back to [place] with [facility] at [phone].

## 2021-03-24 ENCOUNTER — Encounter: Payer: No Typology Code available for payment source | Admitting: Obstetrics and Gynecology

## 2021-03-26 ENCOUNTER — Telehealth: Payer: Self-pay | Admitting: *Deleted

## 2021-03-26 DIAGNOSIS — M542 Cervicalgia: Secondary | ICD-10-CM

## 2021-03-26 DIAGNOSIS — M546 Pain in thoracic spine: Secondary | ICD-10-CM

## 2021-03-26 DIAGNOSIS — M549 Dorsalgia, unspecified: Secondary | ICD-10-CM

## 2021-03-26 NOTE — Telephone Encounter (Signed)
Patient verified DOB Patient presented in person for xray results. Patient is aware of no new concerns being present and was offered a referral to ortho for persistent pain. Patient states back is better, though her neck gives her the most trouble. Patient requested a letter for 1 month vacation. Patient advised of no medical reason for leave and FMLA paperwork would have to be provided to provider. Patient has only been on her job 5 months and may not be eligible for FMLA.

## 2021-04-02 ENCOUNTER — Other Ambulatory Visit: Payer: Self-pay

## 2021-04-02 ENCOUNTER — Ambulatory Visit (INDEPENDENT_AMBULATORY_CARE_PROVIDER_SITE_OTHER): Payer: Medicaid Other | Admitting: Surgery

## 2021-04-02 DIAGNOSIS — S161XXA Strain of muscle, fascia and tendon at neck level, initial encounter: Secondary | ICD-10-CM | POA: Diagnosis not present

## 2021-04-02 MED ORDER — METHYLPREDNISOLONE 4 MG PO TABS: ORAL_TABLET | ORAL | 0 refills | Status: DC
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
## 2021-04-23 ENCOUNTER — Ambulatory Visit: Payer: Medicaid Other | Admitting: Surgery

## 2021-05-09 ENCOUNTER — Telehealth: Payer: Self-pay | Admitting: Surgery

## 2021-05-09 NOTE — Telephone Encounter (Signed)
Please advise 

## 2021-05-09 NOTE — Telephone Encounter (Signed)
Pt is starting a new job and is wondering if she could get a note that states she has neck problems but still can work. She needs it by Monday 05/12/2021.   CB [phone]

## 2021-05-13 ENCOUNTER — Other Ambulatory Visit: Payer: Self-pay

## 2021-05-13 ENCOUNTER — Ambulatory Visit: Payer: Medicaid Other | Attending: Surgery

## 2021-05-13 DIAGNOSIS — R252 Cramp and spasm: Secondary | ICD-10-CM | POA: Diagnosis present

## 2021-05-13 DIAGNOSIS — M542 Cervicalgia: Secondary | ICD-10-CM | POA: Diagnosis present
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
## 2021-05-15 ENCOUNTER — Encounter: Payer: Self-pay | Admitting: Surgery

## 2021-05-15 ENCOUNTER — Ambulatory Visit (INDEPENDENT_AMBULATORY_CARE_PROVIDER_SITE_OTHER): Payer: Medicaid Other | Admitting: Surgery

## 2021-05-15 DIAGNOSIS — M542 Cervicalgia: Secondary | ICD-10-CM
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
## 2021-05-23 ENCOUNTER — Telehealth: Payer: Self-pay

## 2021-05-23 ENCOUNTER — Ambulatory Visit: Payer: Medicaid Other

## 2021-05-23 NOTE — Telephone Encounter (Signed)
LVM through interperter Roseanne Reno [phone] c [facility]) re; no show appt. Pt was advised of the attendance policy and upcoming appt.

## 2021-05-26 ENCOUNTER — Telehealth: Payer: Self-pay | Admitting: Orthopaedic Surgery

## 2021-05-26 NOTE — Telephone Encounter (Signed)
Called pt 1X to reschedule her upcoming appt from 2/7 to later after 2/8 for MRI review appt with Dr. Ophelia Charter. Please reschedule appt after 2/8 with Dr. Ophelia Charter for MRI review
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
## 2021-05-30 ENCOUNTER — Other Ambulatory Visit: Payer: Self-pay

## 2021-05-30 ENCOUNTER — Ambulatory Visit: Payer: Medicaid Other | Attending: Surgery

## 2021-05-30 DIAGNOSIS — M542 Cervicalgia: Secondary | ICD-10-CM | POA: Diagnosis not present

## 2021-05-30 DIAGNOSIS — R252 Cramp and spasm: Secondary | ICD-10-CM | POA: Diagnosis present

## 2021-06-03 ENCOUNTER — Ambulatory Visit: Payer: Medicaid Other | Admitting: Orthopaedic Surgery

## 2021-06-04 ENCOUNTER — Inpatient Hospital Stay: Admission: RE | Admit: 2021-06-04 | Payer: Medicaid Other | Source: Ambulatory Visit
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
## 2021-06-06 ENCOUNTER — Ambulatory Visit: Payer: Medicaid Other

## 2021-06-06 ENCOUNTER — Telehealth: Payer: Self-pay

## 2021-06-06 NOTE — Telephone Encounter (Signed)
Called pt about no show appt. Pt stated she did not she had an appt today. Pt was advised this was her 2nd no show and per the attendance policy her appts can be scheduled only 1 at a time. Pt was reminded of her upcoming appt on 06/13/21. Pt voiced understanding.
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
## 2021-06-13 ENCOUNTER — Other Ambulatory Visit: Payer: Self-pay

## 2021-06-13 ENCOUNTER — Ambulatory Visit: Payer: Medicaid Other

## 2021-06-13 DIAGNOSIS — R252 Cramp and spasm: Secondary | ICD-10-CM

## 2021-06-13 DIAGNOSIS — M542 Cervicalgia: Secondary | ICD-10-CM | POA: Diagnosis not present

## 2021-08-04 ENCOUNTER — Encounter: Payer: Self-pay | Admitting: Physician Assistant

## 2021-08-04 ENCOUNTER — Ambulatory Visit: Payer: Medicaid Other | Admitting: Physician Assistant

## 2021-08-04 VITALS — BP 136/87 | HR 68 | Temp 98.2°F | Resp 18 | Ht 70.5 in | Wt 201.0 lb

## 2021-08-04 DIAGNOSIS — Z3202 Encounter for pregnancy test, result negative: Secondary | ICD-10-CM

## 2021-08-04 DIAGNOSIS — N912 Amenorrhea, unspecified: Secondary | ICD-10-CM | POA: Diagnosis not present

## 2021-08-04 LAB — POCT URINE PREGNANCY: Preg Test, Ur: NEGATIVE

## 2021-08-04 NOTE — Progress Notes (Signed)
Patient presents with stomach pain over the past few days with no pain at this time. ?Patient and husband are trying for a "girl" after conceiving 4 boys ages 5,13,11 and 39. ?Patient has not taken medication today. ?Patient has eaten today. ? ?

## 2021-08-04 NOTE — Patient Instructions (Signed)
We will call you with today's lab result.  Your urine pregnancy test  was negative ? ?Roney Jaffe, PA-C ?Physician Assistant ?[facility] ?[email or web address] ? ? ? ?

## 2021-08-04 NOTE — Progress Notes (Signed)
? ?Established Patient Office Visit ? ?Subjective:  ?Patient ID: Jacqueline Sullivan, female    DOB: 1980-09-11  Age: 41 y.o. MRN: [id number] ? ?CC:  ?Chief Complaint  ?Patient presents with  ? Amenorrhea  ? ? ?HPI ?Jacqueline Sullivan reports that she has been having pain in her abdomen intermittently since April 3.  States that she should have started her menses on that date.  Describes her pain as cramping "like a period", denies any vaginal bleeding.  States that she is actively trying to become pregnant. ? ?States that she has not tried anything for relief of the pain.  Husband is present ? ?Due to language barrier, an interpreter was present during the history-taking and subsequent discussion (and for part of the physical exam) with this patient. ? ? ?Past Medical History:  ?Diagnosis Date  ? Gastroesophageal reflux disease without esophagitis 10/22/2020  ? ? ?History reviewed. No pertinent surgical history. ? ?Family History  ?Problem Relation Age of Onset  ? Hypertension Mother   ? ? ?Social History  ? ?Socioeconomic History  ? Marital status: Married  ?  Spouse name: Not on file  ? Number of children: Not on file  ? Years of education: Not on file  ? Highest education level: Not on file  ?Occupational History  ? Not on file  ?Tobacco Use  ? Smoking status: Never  ? Smokeless tobacco: Never  ?Substance and Sexual Activity  ? Alcohol use: Never  ? Drug use: Never  ? Sexual activity: Yes  ?Other Topics Concern  ? Not on file  ?Social History Narrative  ? Not on file  ? ?Social Determinants of Health  ? ?Financial Resource Strain: Not on file  ?Food Insecurity: Not on file  ?Transportation Needs: Not on file  ?Physical Activity: Not on file  ?Stress: Not on file  ?Social Connections: Not on file  ?Intimate Partner Violence: Not on file  ? ? ?Outpatient Medications Prior to Visit  ?Medication Sig Dispense Refill  ? omeprazole (PRILOSEC) 20 MG capsule Take 1 capsule (20 mg total) by mouth daily. 30 capsule 3  ?  cloNIDine (CATAPRES) 0.1 MG tablet Take 1 tablet (0.1 mg total) by mouth at bedtime as needed. For hot flashes (Patient not taking: Reported on 08/04/2021) 30 tablet 3  ? cyclobenzaprine (FLEXERIL) 10 MG tablet Take 1 tablet (10 mg total) by mouth 3 (three) times daily as needed for muscle spasms. 30 tablet 0  ? methylPREDNISolone (MEDROL) 4 MG tablet 6 day taper to be taken as directed. 21 tablet 0  ? naproxen (NAPROSYN) 500 MG tablet Take 1 tablet (500 mg total) by mouth 2 (two) times daily. 30 tablet 0  ? ?No facility-administered medications prior to visit.  ? ? ?No Known Allergies ? ?ROS ?Review of Systems  ?Constitutional:  Negative for chills and fever.  ?HENT: Negative.    ?Eyes: Negative.   ?Respiratory:  Negative for shortness of breath.   ?Cardiovascular:  Negative for chest pain.  ?Gastrointestinal:  Positive for abdominal pain. Negative for blood in stool, constipation, diarrhea, nausea and vomiting.  ?Endocrine: Negative.   ?Genitourinary:  Negative for dysuria, hematuria, vaginal bleeding and vaginal discharge.  ?Musculoskeletal:  Negative for back pain.  ?Skin: Negative.   ?Allergic/Immunologic: Negative.   ?Neurological: Negative.   ?Hematological: Negative.   ?Psychiatric/Behavioral: Negative.    ? ?  ?Objective:  ?  ?Physical Exam ?Vitals and nursing note reviewed.  ?Constitutional:   ?   Appearance: Normal appearance.  ?  HENT:  ?   Head: Normocephalic and atraumatic.  ?   Right Ear: External ear normal.  ?   Left Ear: External ear normal.  ?   Nose: Nose normal.  ?   Mouth/Throat:  ?   Mouth: Mucous membranes are moist.  ?   Pharynx: Oropharynx is clear.  ?Eyes:  ?   Extraocular Movements: Extraocular movements intact.  ?   Conjunctiva/sclera: Conjunctivae normal.  ?   Pupils: Pupils are equal, round, and reactive to light.  ?Cardiovascular:  ?   Rate and Rhythm: Normal rate and regular rhythm.  ?   Pulses: Normal pulses.  ?   Heart sounds: Normal heart sounds.  ?Pulmonary:  ?   Effort: Pulmonary  effort is normal.  ?   Breath sounds: Normal breath sounds.  ?Abdominal:  ?   General: Abdomen is flat. Bowel sounds are normal.  ?   Palpations: Abdomen is soft.  ?   Tenderness: There is no abdominal tenderness.  ?Musculoskeletal:     ?   General: Normal range of motion.  ?   Cervical back: Normal range of motion and neck supple.  ?Skin: ?   General: Skin is warm and dry.  ?Neurological:  ?   General: No focal deficit present.  ?   Mental Status: She is alert and oriented to person, place, and time.  ?Psychiatric:     ?   Mood and Affect: Mood normal.     ?   Behavior: Behavior normal.     ?   Thought Content: Thought content normal.     ?   Judgment: Judgment normal.  ? ? ?BP 136/87 (BP Location: Left Arm, Patient Position: Sitting, Cuff Size: Normal)   Pulse 68   Temp 98.2 ?F (36.8 ?C) (Oral)   Resp 18   Ht 5' 10.5" (1.791 m)   Wt 201 lb (91.2 kg)   LMP 06/27/2021   SpO2 98%   BMI 28.43 kg/m?  ?Wt Readings from Last 3 Encounters:  ?08/04/21 201 lb (91.2 kg)  ?05/15/21 198 lb (89.8 kg)  ?03/04/21 198 lb (89.8 kg)  ? ? ? ?Health Maintenance Due  ?Topic Date Due  ? HIV Screening  Never done  ? Hepatitis C Screening  Never done  ? TETANUS/TDAP  Never done  ? PAP SMEAR-Modifier  Never done  ? ? ?There are no preventive care reminders to display for this patient. ? ?No results found for: TSH ?Lab Results  ?Component Value Date  ? WBC 7.1 06/05/2020  ? HGB 11.9 (L) 06/05/2020  ? HCT 35.8 (L) 06/05/2020  ? MCV 83.6 06/05/2020  ? PLT 246 06/05/2020  ? ?Lab Results  ?Component Value Date  ? NA 138 06/05/2020  ? K 3.9 06/05/2020  ? CO2 25 06/05/2020  ? GLUCOSE 100 (H) 06/05/2020  ? BUN 11 06/05/2020  ? CREATININE 0.77 06/05/2020  ? BILITOT 0.6 06/05/2020  ? ALKPHOS 87 06/05/2020  ? AST 15 06/05/2020  ? ALT 10 06/05/2020  ? PROT 7.1 06/05/2020  ? ALBUMIN 3.0 (L) 06/05/2020  ? CALCIUM 8.9 06/05/2020  ? ANIONGAP 8 06/05/2020  ? ?No results found for: CHOL ?No results found for: HDL ?No results found for: LDLCALC ?No  results found for: TRIG ?No results found for: CHOLHDL ?No results found for: HGBA1C ? ?  ?Assessment & Plan:  ? ?Problem List Items Addressed This Visit   ?None ?Visit Diagnoses   ? ? Amenorrhea    -  Primary  ?  Relevant Orders  ? POCT urine pregnancy (Completed)  ? hCG, serum, qualitative (Completed)  ? ?  ? ? ?No orders of the defined types were placed in this encounter. ?1. Amenorrhea ?Urine pregnancy negative.  Patient education given on supportive care, red flags given for prompt reevaluation ?- POCT urine pregnancy ?- hCG, serum, qualitative ? ?2. Encounter for pregnancy test with result negative ? ? ? ?I have reviewed the patient's medical history (PMH, PSH, Social History, Family History, Medications, and allergies) , and have been updated if relevant. I spent 20 minutes reviewing chart and  face to face time with patient. ? ? ? ? ?Follow-up: Return if symptoms worsen or fail to improve.  ? ? ?Jestin Burbach S Mayers, PA-C ?

## 2021-08-05 LAB — HCG, SERUM, QUALITATIVE: hCG,Beta Subunit,Qual,Serum: NEGATIVE m[IU]/mL (ref <6)

## 2021-08-08 ENCOUNTER — Telehealth: Payer: Self-pay | Admitting: *Deleted

## 2021-08-08 NOTE — Telephone Encounter (Signed)
Interpreter Name:[employer] Interpreter #: [id number] ?Voicemail states to give a call back to [place] with [facility] at [phone]. ?

## 2021-08-08 NOTE — Telephone Encounter (Signed)
-----   Message from Roney Jaffe, PA-C sent at 08/05/2021  9:18 AM EDT ----- ?Please call patient and let her know that her urine pregnancy test was negative. ?

## 2021-10-15 ENCOUNTER — Ambulatory Visit: Payer: Medicaid Other | Attending: Physician Assistant | Admitting: Physician Assistant

## 2021-10-15 ENCOUNTER — Other Ambulatory Visit: Payer: Self-pay

## 2021-10-15 ENCOUNTER — Encounter: Payer: Self-pay | Admitting: Physician Assistant

## 2021-10-15 VITALS — BP 135/85 | HR 67 | Wt 201.4 lb

## 2021-10-15 DIAGNOSIS — E539 Vitamin B deficiency, unspecified: Secondary | ICD-10-CM | POA: Diagnosis not present

## 2021-10-15 DIAGNOSIS — R202 Paresthesia of skin: Secondary | ICD-10-CM | POA: Diagnosis not present

## 2021-10-15 DIAGNOSIS — R519 Headache, unspecified: Secondary | ICD-10-CM | POA: Diagnosis not present

## 2021-10-15 DIAGNOSIS — R0981 Nasal congestion: Secondary | ICD-10-CM

## 2021-10-15 DIAGNOSIS — R739 Hyperglycemia, unspecified: Secondary | ICD-10-CM

## 2021-10-15 DIAGNOSIS — Z789 Other specified health status: Secondary | ICD-10-CM

## 2021-10-15 MED ORDER — FLUTICASONE PROPIONATE 50 MCG/ACT NA SUSP
2.0000 | Freq: Every day | NASAL | 6 refills | Status: AC
  Filled 2021-10-15: qty 16, 30d supply, fill #0

## 2021-10-15 NOTE — Progress Notes (Signed)
Patient ID: Jacqueline Sullivan, female   DOB: [birth year], 41 y.o.   MRN: [id number]    Jacqueline Sullivan, is a 41 y.o. female  XNA:[id number]  RKY:[id number]  DOB - 11/27/1980  Chief Complaint  Patient presents with   Hypertension   Numbness    In legs        Subjective:   Jacqueline Sullivan is a 41 y.o. female here today Frequent HA that occur in the morning and at times lasts all day.  Occur about 4 times a week.  HA in mid head and frontal head with pressure. This has been a long time.  Sometimes took tylenol which has helped.    feels heat coming off of her feet esp after eating sugar or salt.   Also feels numbness and tingling throughout her body.  No CP/SOB  Zina with [facility]  No problems updated.  ALLERGIES: No Known Allergies  PAST MEDICAL HISTORY: Past Medical History:  Diagnosis Date   Gastroesophageal reflux disease without esophagitis 10/22/2020    MEDICATIONS AT HOME: Prior to Admission medications   Medication Sig Start Date End Date Taking? Authorizing Provider  fluticasone (FLONASE) 50 MCG/ACT nasal spray Place 2 sprays into both nostrils daily. 10/15/21  Yes Feige Lowdermilk, Marzella Schlein, PA-C    ROS: Neg HEENT Neg resp Neg cardiac Neg GI Neg GU Neg psych  Objective:   Vitals:   10/15/21 1609  BP: 135/85  Pulse: 67  SpO2: 98%  Weight: 201 lb 6.4 oz (91.4 kg)   Exam General appearance : Awake, alert, not in any distress. Speech Clear. Not toxic looking HEENT: Atraumatic and Normocephalic;  turbinates congested and B TM congested Neck: Supple, no JVD. No cervical lymphadenopathy.  Chest: Good air entry bilaterally, CTAB.  No rales/rhonchi/wheezing CVS: S1 S2 regular, no murmurs.  Extremities: B/L Lower Ext shows no edema, both legs are warm to touch Neurology: Awake alert, and oriented X 3, CN II-XII intact, Non focal Skin: No Rash  Data Review No results found for: "HGBA1C"  Assessment & Plan   1. Nonintractable episodic headache,  unspecified headache type Use tylenol prn - Thyroid Panel With TSH - fluticasone (FLONASE) 50 MCG/ACT nasal spray; Place 2 sprays into both nostrils daily.  Dispense: 16 g; Refill: 6 - Comprehensive metabolic panel  2. 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 Sinus congestion Likely causing #1 - fluticasone (FLONASE) 50 MCG/ACT nasal spray; Place 2 sprays into both nostrils daily.  Dispense: 16 g; Refill: 6  5. Hyperglycemia Blood sugar borderline high at 100 over a year ago - Hemoglobin A1c - Comprehensive metabolic panel  6. Language barrier [facility] interpreters "Zina" used and additional time performing visit was required.   Return in about 6 months (around 04/16/2022), or if symptoms worsen or fail to improve.  The patient was given clear instructions to go to ER or return to medical center if symptoms don't improve, worsen or new problems develop. The patient verbalized understanding. The patient was told to call to get lab results if they haven't heard anything in the next week.      Georgian Co, PA-C [facility] [place], [place] [phone]   10/15/2021, 4:30 PM

## 2021-10-16 LAB — COMPREHENSIVE METABOLIC PANEL
ALT: 14 IU/L (ref 0–32)
AST: 14 IU/L (ref 0–40)
Albumin/Globulin Ratio: 1.4 (ref 1.2–2.2)
Albumin: 4.2 g/dL (ref 3.8–4.8)
Alkaline Phosphatase: 115 IU/L (ref 44–121)
BUN/Creatinine Ratio: 15 (ref 9–23)
BUN: 12 mg/dL (ref 6–24)
Bilirubin Total: 0.3 mg/dL (ref 0.0–1.2)
CO2: 24 mmol/L (ref 20–29)
Calcium: 9.6 mg/dL (ref 8.7–10.2)
Chloride: 101 mmol/L (ref 96–106)
Creatinine, Ser: 0.81 mg/dL (ref 0.57–1.00)
Globulin, Total: 2.9 g/dL (ref 1.5–4.5)
Glucose: 121 mg/dL — ABNORMAL HIGH (ref 70–99)
Potassium: 3.6 mmol/L (ref 3.5–5.2)
Sodium: 138 mmol/L (ref 134–144)
Total Protein: 7.1 g/dL (ref 6.0–8.5)
eGFR: 93 mL/min/{1.73_m2} (ref >59)

## 2021-10-16 LAB — THYROID PANEL WITH TSH
Free Thyroxine Index: 2.3 (ref 1.2–4.9)
T3 Uptake Ratio: 25 % (ref 24–39)
T4, Total: 9.2 ug/dL (ref 4.5–12.0)
TSH: 0.481 u[IU]/mL (ref 0.450–4.500)

## 2021-10-16 LAB — HEMOGLOBIN A1C
Est. average glucose Bld gHb Est-mCnc: 117 mg/dL
Hgb A1c MFr Bld: 5.7 % — ABNORMAL HIGH (ref 4.8–5.6)

## 2021-12-31 ENCOUNTER — Emergency Department (HOSPITAL_COMMUNITY)
Admission: EM | Admit: 2021-12-31 | Discharge: 2021-12-31 | Disposition: A | Discharge status: Home / Self Care | Payer: Medicaid Other | Attending: Emergency Medicine | Admitting: Emergency Medicine

## 2021-12-31 ENCOUNTER — Encounter (HOSPITAL_COMMUNITY): Payer: Self-pay | Admitting: Emergency Medicine

## 2021-12-31 DIAGNOSIS — R519 Headache, unspecified: Secondary | ICD-10-CM | POA: Diagnosis present

## 2021-12-31 DIAGNOSIS — U071 COVID-19: Secondary | ICD-10-CM | POA: Insufficient documentation

## 2021-12-31 LAB — SARS CORONAVIRUS 2 BY RT PCR: SARS Coronavirus 2 by RT PCR: POSITIVE — AB

## 2021-12-31 MED ORDER — ACETAMINOPHEN 325 MG PO TABS
650.0000 mg | ORAL_TABLET | Freq: Once | ORAL | Status: AC
  Administered 2021-12-31: 650 mg via ORAL
  Filled 2021-12-31: qty 2

## 2021-12-31 NOTE — ED Triage Notes (Signed)
Pt endorses headache and fever for 2 days. Her kids have covid for past 5 days.

## 2021-12-31 NOTE — ED Provider Notes (Signed)
[facility] EMERGENCY DEPARTMENT Provider Note   CSN: [id number] Arrival date & time: 12/31/21  1113     History  Chief Complaint  Patient presents with   Headache    Jacqueline Sullivan is a 41 y.o. female who presents to the ED complaining of headache onset 2 days.  Her children have been diagnosed with COVID for the past 5 days.  Has associated rhinorrhea, nasal congestion, cough.  Has tried Tylenol, ibuprofen, aspirin without relief of her symptoms.  Denies past medical history of hypertension or diabetes.  The history is provided by the patient. A language interpreter was used (Arabic).       Home Medications Prior to Admission medications   Medication Sig Start Date End Date Taking? Authorizing Provider  fluticasone (FLONASE) 50 MCG/ACT nasal spray Place 2 sprays into both nostrils daily. 10/15/21   Anders Simmonds, PA-C      Allergies    Patient has no known allergies.    Review of Systems   Review of Systems  Constitutional:  Negative for fever.  HENT:  Positive for congestion and rhinorrhea.   Respiratory:  Positive for cough.   Neurological:  Positive for headaches.  All other systems reviewed and are negative.   Physical Exam Updated Vital Signs BP (!) 135/97 (BP Location: Right Arm)   Pulse 75   Temp 99.4 F (37.4 C) (Oral)   Resp 16   Ht 5\' 10"  (1.778 m)   Wt 91 kg   SpO2 100%   BMI 28.79 kg/m  Physical Exam Vitals and nursing note reviewed.  Constitutional:      General: She is not in acute distress.    Appearance: She is not diaphoretic.  HENT:     Head: Normocephalic and atraumatic.     Mouth/Throat:     Pharynx: No oropharyngeal exudate.  Eyes:     General: No scleral icterus.    Conjunctiva/sclera: Conjunctivae normal.  Cardiovascular:     Rate and Rhythm: Normal rate and regular rhythm.     Pulses: Normal pulses.     Heart sounds: Normal heart sounds.  Pulmonary:     Effort: Pulmonary effort is normal. No  respiratory distress.     Breath sounds: Normal breath sounds. No wheezing.  Abdominal:     General: There is no distension.     Palpations: Abdomen is soft.  Musculoskeletal:        General: Normal range of motion.     Cervical back: Normal range of motion and neck supple.  Skin:    General: Skin is warm and dry.  Neurological:     Mental Status: She is alert.  Psychiatric:        Behavior: Behavior normal.    ED Results / Procedures / Treatments   Labs (all labs ordered are listed, but only abnormal results are displayed) Labs Reviewed  SARS CORONAVIRUS 2 BY RT PCR - Abnormal; Notable for the following components:      Result Value   SARS Coronavirus 2 by RT PCR POSITIVE (*)    All other components within normal limits    EKG None  Radiology No results found.  Procedures Procedures    Medications Ordered in ED Medications  acetaminophen (TYLENOL) tablet 650 mg (650 mg Oral Given 12/31/21 1239)    ED Course/ Medical Decision Making/ A&P Clinical Course as of 12/31/21 1258  Wed Dec 31, 2021  1240 SARS Coronavirus 2 by RT PCR(!): POSITIVE [SB]  1255 Discussed with patient positive COVID swab findings. Discussed with patient discharge treatment plan. Answered all available questions. Pt appears safe for discharge at this time. [SB]    Clinical Course User Index [SB] Juliett Eastburn A, PA-C                           Medical Decision Making Amount and/or Complexity of Data Reviewed Labs:  Decision-making details documented in ED Course.  Risk OTC drugs.   Pt presents with concerns for headache, subjective fever, rhinorrhea, nasal congestion onset 2 days.  Has sick contacts at home of children who are diagnosed with COVID.  Has tried over-the-counter Tylenol, ibuprofen, aspirin without relief of her symptoms.  Patient afebrile.  On exam patient without acute cardiovascular respiratory exam findings.  Differential diagnosis includes COVID, flu, pneumonia, viral URI  with cough.    Labs:  I ordered, and personally interpreted labs.  The pertinent results include:   COVID swab positive   Medications:  I ordered medication including Tylenol for fever management  Reevaluation of the patient after these medicines and interventions, I reevaluated the patient and found that they have improved I have reviewed the patients home medicines and have made adjustments as needed     Disposition: Pt presentation suspicious for COVID-19. Doubt flu, PNA, or viral URI with cough at this time. After consideration of the diagnostic results and the patients response to treatment, I feel that the patient would benefit from Discharge home. Thorough discussion regarding quarantine/isolation period as per CDC guidelines. Work note provided. Supportive care measures and strict return precautions discussed with patient at bedside. Pt acknowledges and verbalizes understanding. Pt appears safe for discharge. Follow up as indicated in discharge paperwork.    This chart was dictated using voice recognition software, Dragon. Despite the best efforts of this provider to proofread and correct errors, errors may still occur which can change documentation meaning.   Final Clinical Impression(s) / ED Diagnoses Final diagnoses:  COVID-19    Rx / DC Orders ED Discharge Orders     None         Vrinda Heckstall A, PA-C 12/31/21 1715    Arby Barrette, MD 01/13/22 1205

## 2021-12-31 NOTE — Discharge Instructions (Addendum)
It was a pleasure taking care of you!    Your COVID swab was positive for COVID.  According to the CDC, you must self quarantine for 5 days from the start of your symptoms.  Your quarantine period ends on 01/03/22.  You may take over-the-counter cough and cold medications as needed for your symptoms. Ensure to maintain fluid intake with tea, soup, broth, Pedialyte, Gatorade, water. You may follow-up with your primary care provider as needed.  Return to the Emergency Department if you are experiencing trouble breathing, worsening or increasing chest pain, decreased fluid intake or worsening symptoms.   ??? ??? ?? ????? ????? ???????? ??!  ???? ???? ????? ?????? ?????? ?? ??????? ?????? ??????. ????? ????? ??????? ??? ???????? ??? ???? ????? ????? ???? 5 ???? ?? ????? ???? ???????. ????? ???? ????? ????? ????? ?? ?? 01/03/22. ????? ????? ????? ?????? ?????? ??????? ??? ???? ???? ??? ?????? ???????. ???? ?? ?????? ??? ????? ??????? ?? ????? ??????? ?????? ???????????? ?????????? ??????. ????? ???????? ?? ???? ??????? ??????? ????? ?? ??? ??????. ???? ??? ??? ??????? ??? ??? ????? ?? ????? ?? ??????? ?? ????? ?? ????? ???? ?????? ?? ?????? ????? ??????? ?? ????? ???????. laqad kan min dawaei sururi aliaetina' bika! kanat mashat fayrus kuruna alkhasat bik 'iijabiatan lifayrus kuruna. wfqan limarkaz alsaytarat ealaa al'amradi, yajib ealayk alhajar alsihiyu limudat 5 'ayaam min bidayat zuhur al'aeradi. tantahi fatrat alhajar alsihiyi alkhasi bik fi 01/03/22. [place] tan[place]ul 'adwiat alsueal walbard almutahat dun wasfat tibiyat hasab alhajat li'aeradiki. ta'akad min alhifaz ealaa tanawul alsawayil mae alshaay walhisa' walmarq walbidialayt waljaturid walma'i. [place] almutabaeat mae maqadam alrieayat al'awaliat alkhasi bik hasab alhajati. airjie 'iilaa qism altawari 'iidha kunt tueani min sueubat fi altanafusi, 'aw tafaqum 'aw ziadat alam alsadra, 'aw ainkhifad tanawul alsawayil 'aw tafaqum al'aeradi.

## 2022-04-21 ENCOUNTER — Emergency Department (HOSPITAL_COMMUNITY)
Admission: EM | Admit: 2022-04-21 | Discharge: 2022-04-22 | Disposition: A | Discharge status: Home / Self Care | Payer: Medicaid Other | Attending: Emergency Medicine | Admitting: Emergency Medicine

## 2022-04-21 ENCOUNTER — Encounter (HOSPITAL_COMMUNITY): Payer: Self-pay | Admitting: Emergency Medicine

## 2022-04-21 ENCOUNTER — Emergency Department (HOSPITAL_COMMUNITY): Payer: Medicaid Other

## 2022-04-21 DIAGNOSIS — R059 Cough, unspecified: Secondary | ICD-10-CM | POA: Diagnosis present

## 2022-04-21 DIAGNOSIS — Z1152 Encounter for screening for COVID-19: Secondary | ICD-10-CM | POA: Diagnosis not present

## 2022-04-21 DIAGNOSIS — J069 Acute upper respiratory infection, unspecified: Secondary | ICD-10-CM | POA: Insufficient documentation

## 2022-04-21 LAB — CBC WITH DIFFERENTIAL/PLATELET
Abs Immature Granulocytes: 0.01 10*3/uL (ref 0.00–0.07)
Basophils Absolute: 0 10*3/uL (ref 0.0–0.1)
Basophils Relative: 0 %
Eosinophils Absolute: 0.1 10*3/uL (ref 0.0–0.5)
Eosinophils Relative: 3 %
HCT: 38.6 % (ref 36.0–46.0)
Hemoglobin: 12.6 g/dL (ref 12.0–15.0)
Immature Granulocytes: 0 %
Lymphocytes Relative: 34 %
Lymphs Abs: 1.9 10*3/uL (ref 0.7–4.0)
MCH: 27.4 pg (ref 26.0–34.0)
MCHC: 32.6 g/dL (ref 30.0–36.0)
MCV: 83.9 fL (ref 80.0–100.0)
Monocytes Absolute: 0.5 10*3/uL (ref 0.1–1.0)
Monocytes Relative: 9 %
Neutro Abs: 3 10*3/uL (ref 1.7–7.7)
Neutrophils Relative %: 54 %
Platelets: 203 10*3/uL (ref 150–400)
RBC: 4.6 MIL/uL (ref 3.87–5.11)
RDW: 15.1 % (ref 11.5–15.5)
WBC: 5.5 10*3/uL (ref 4.0–10.5)
nRBC: 0 % (ref 0.0–0.2)

## 2022-04-21 LAB — BASIC METABOLIC PANEL
Anion gap: 5 (ref 5–15)
BUN: 9 mg/dL (ref 6–20)
CO2: 28 mmol/L (ref 22–32)
Calcium: 9.1 mg/dL (ref 8.9–10.3)
Chloride: 108 mmol/L (ref 98–111)
Creatinine, Ser: 0.7 mg/dL (ref 0.44–1.00)
GFR, Estimated: >60 mL/min (ref >60)
Glucose, Bld: 89 mg/dL (ref 70–99)
Potassium: 4.2 mmol/L (ref 3.5–5.1)
Sodium: 141 mmol/L (ref 135–145)

## 2022-04-21 LAB — RESP PANEL BY RT-PCR (RSV, FLU A&B, COVID)  RVPGX2
Influenza A by PCR: NEGATIVE
Influenza B by PCR: NEGATIVE
Resp Syncytial Virus by PCR: NEGATIVE
SARS Coronavirus 2 by RT PCR: NEGATIVE

## 2022-04-21 LAB — GROUP A STREP BY PCR: Group A Strep by PCR: NOT DETECTED

## 2022-04-21 NOTE — ED Triage Notes (Signed)
Using interpreter, Pt endorses cough and body aches for a week.

## 2022-04-21 NOTE — ED Provider Triage Note (Signed)
Emergency Medicine Provider Triage Evaluation Note  Jacqueline Sullivan , a 41 y.o. female  was evaluated in triage.  Pt complains of 1 week of sore throat, cough, general weakness.  Patient states that symptoms began with severe sore throat.  She continues to have sore throat at this time.  She also complains of a cough which has become severe to the point where she feels she has burst a blood vessel.  Denies known fevers at home. Arabic interpreter used throughout encounter  Review of Systems  Positive: As above Negative: As above  Physical Exam  There were no vitals taken for this visit. Gen:   Awake, no distress   Resp:  Normal effort  MSK:   Moves extremities without difficulty  Other:    Medical Decision Making  Medically screening exam initiated at 4:46 PM.  Appropriate orders placed.  Jacqueline Sullivan was informed that the remainder of the evaluation will be completed by another provider, this initial triage assessment does not replace that evaluation, and the importance of remaining in the ED until their evaluation is complete.     Darrick Grinder, PA-C 04/21/22 1648

## 2022-04-22 MED ORDER — BENZONATATE 100 MG PO CAPS: 100.0000 mg | ORAL_CAPSULE | Freq: Three times a day (TID) | ORAL | 0 refills | Status: AC | PRN

## 2022-04-22 MED ORDER — IBUPROFEN 600 MG PO TABS: 600.0000 mg | ORAL_TABLET | Freq: Four times a day (QID) | ORAL | 0 refills | Status: DC | PRN

## 2022-04-22 NOTE — ED Provider Notes (Signed)
[facility] EMERGENCY DEPARTMENT Provider Note  CSN: [id number] Arrival date & time: 04/21/22 1347  Chief Complaint(s) Cough  HPI Jacqueline Sullivan is a 41 y.o. female without significant past medical history presenting to the emergency department with cough.  Patient reports associated runny nose and sore throat, body aches.  Reports subjective fevers and chills at home.  She reports about 5 days of symptoms.  No productive cough.  She was worried because she was coughing and developed some bruising above her eye and thought she had burst  blood vessel.  Denies any injury or pain.  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 Authorizing Provider  fluticasone (FLONASE) 50 MCG/ACT nasal spray Place 2 sprays into both nostrils daily. 10/15/21   Anders Simmonds, PA-C                                                                                                                                    Past Surgical History History reviewed. No pertinent surgical history. Family History Family History  Problem Relation Age of Onset   Hypertension Mother     Social History Social History   Tobacco Use   Smoking status: Never   Smokeless tobacco: Never  Substance Use Topics   Alcohol use: Never   Drug use: Never   Allergies Patient has no known allergies.  Review of Systems Review of Systems  All other systems reviewed and are negative.   Physical Exam Vital Signs  I have reviewed the triage vital signs BP (!) 128/90   Pulse 64   Temp 98.3 F (36.8 C) (Oral)   Resp 18   LMP  04/06/2022   SpO2 100%  Physical Exam Vitals and nursing note reviewed.  Constitutional:      General: She is not in acute distress.    Appearance: She is well-developed.  HENT:     Head: Normocephalic and atraumatic.     Mouth/Throat:     Mouth: Mucous membranes are moist.  Eyes:     Extraocular Movements: Extraocular movements intact.     Conjunctiva/sclera: Conjunctivae normal.     Pupils: Pupils are equal, round, and reactive to light.     Comments: Mild left periorbital bruising to the superior eyelid  Cardiovascular:     Rate and Rhythm: Normal rate and regular rhythm.     Heart sounds: No murmur heard. Pulmonary:     Effort: Pulmonary effort is normal. No respiratory distress.     Breath sounds: Normal breath sounds.  Abdominal:     General: Abdomen is  flat.     Palpations: Abdomen is soft.     Tenderness: There is no abdominal tenderness.  Musculoskeletal:        General: No tenderness.     Right lower leg: No edema.     Left lower leg: No edema.  Skin:    General: Skin is warm and dry.  Neurological:     General: No focal deficit present.     Mental Status: She is alert. Mental status is at baseline.  Psychiatric:        Mood and Affect: Mood normal.        Behavior: Behavior normal.     ED Results and Treatments Labs (all labs ordered are listed, but only abnormal results are displayed) Labs Reviewed  RESP PANEL BY RT-PCR (RSV, FLU A&B, COVID)  RVPGX2  GROUP A STREP BY PCR  BASIC METABOLIC PANEL  CBC WITH DIFFERENTIAL/PLATELET                                                                                                                          Radiology DG Chest 2 View  Result Date: 04/21/2022 CLINICAL DATA:  Shortness of breath and cough. EXAM: CHEST - 2 VIEW COMPARISON:  August 19, 2020 FINDINGS: Chronic changes are identified in the lateral left lung base with pleural calcification in the so she aided opacity. The heart, hila, mediastinum, lungs,  and pleura are otherwise unchanged and unremarkable. IMPRESSION: Chronic changes in the lateral left lung base. No acute abnormalities identified. Electronically Signed   By: Gerome Sam III M.D.   On: 04/21/2022 17:34    Pertinent labs & imaging results that were available during my care of the patient were reviewed by me and considered in my medical decision making (see MDM for details).  Medications Ordered in ED Medications - No data to display                                                                                                                                   Procedures Procedures  (including critical care time)  Medical Decision Making / ED Course   MDM:  41 year old female presenting with cough.  Patient well-appearing, physical exam reassuring.  Suspect viral URI.  Suspect bruising due to coughing.  Patient denies being hit or concern for abuse.  Otherwise denies vision changes and eye exam seems normal.  She has no  pain.  Chest x-ray without evidence of pneumonia.  Labs otherwise reassuring.  Strep PCR negative.  Not positive for COVID/flu.  Will prescribe Tessalon and ibuprofen.  Advise close follow-up with her primary physician.  Discussed return precautions for any vision changes or eye pain. Will discharge patient to home. All questions answered. Patient comfortable with plan of discharge. Return precautions discussed with patient and specified on the after visit summary.       Additional history obtained: -Additional history obtained from spouse -External records from outside source obtained and reviewed including: Chart review including previous notes, labs, imaging, consultation notes including ED visit for COVID   Lab Tests: -I ordered, reviewed, and interpreted labs.   The pertinent results include:   Labs Reviewed  RESP PANEL BY RT-PCR (RSV, FLU A&B, COVID)  RVPGX2  GROUP A STREP BY PCR  BASIC METABOLIC PANEL  CBC WITH DIFFERENTIAL/PLATELET     Notable for normal labs   Imaging Studies ordered: I ordered imaging studies including CXR  On my interpretation imaging demonstrates clear lungs I independently visualized and interpreted imaging. I agree with the radiologist interpretation   Medicines ordered and prescription drug management: No orders of the defined types were placed in this encounter.   -I have reviewed the patients home medicines and have made adjustments as needed   Social Determinants of Health:  Diagnosis or treatment significantly limited by social determinants of health: immigrant   Co morbidities that complicate the patient evaluation  Past Medical History:  Diagnosis Date   Gastroesophageal reflux disease without esophagitis 10/22/2020      Dispostion: Disposition decision including need for hospitalization was considered, and patient discharged from emergency department.    Final Clinical Impression(s) / ED Diagnoses Final diagnoses:  Viral URI with cough     This chart was dictated using voice recognition software.  Despite best efforts to proofread,  errors can occur which can change the documentation meaning.    Lonell Grandchild, MD 04/22/22 [phone]

## 2022-04-22 NOTE — ED Notes (Signed)
Pt name called for updated vitals.

## 2022-07-15 ENCOUNTER — Encounter: Payer: Self-pay | Admitting: Family Medicine

## 2022-07-15 ENCOUNTER — Ambulatory Visit: Payer: Medicaid Other | Attending: Family Medicine | Admitting: Family Medicine

## 2022-07-15 VITALS — BP 112/73 | HR 80 | Temp 98.0°F | Ht 70.0 in | Wt 195.0 lb

## 2022-07-15 DIAGNOSIS — H1013 Acute atopic conjunctivitis, bilateral: Secondary | ICD-10-CM | POA: Diagnosis not present

## 2022-07-15 DIAGNOSIS — H11002 Unspecified pterygium of left eye: Secondary | ICD-10-CM

## 2022-07-15 MED ORDER — CETIRIZINE HCL 10 MG PO TABS: 10.0000 mg | ORAL_TABLET | Freq: Every day | ORAL | 1 refills | Status: AC

## 2022-07-15 MED ORDER — OLOPATADINE HCL 0.1 % OP SOLN: 1.0000 [drp] | Freq: Two times a day (BID) | OPHTHALMIC | 2 refills | Status: DC

## 2022-07-15 NOTE — Progress Notes (Signed)
Subjective:  Patient ID: Jacqueline Sullivan, female    DOB: October 01, 1980  Age: 42 y.o. MRN: [id number]  CC: Eye Pain   HPI Jacqueline Sullivan is a 42 y.o. year old female here to establish care. Seen with the aid of an Arabic interpreter.  Interval History: She presents for an acute visit. Left eye has been red with a stabbing pain and this has been intermittent for 7 years but of recent returned a month ago with increased frequency with itching but no blurry vision. Sometimes has mucus in corners of eyes, tearing but eye lids are not matted. She has no rhinorrhea. Endorses sneezing on some occasions but no sinus drainage no sinus pressure. Past Medical History:  Diagnosis Date   Gastroesophageal reflux disease without esophagitis 10/22/2020    No past surgical history on file.  Family History  Problem Relation Age of Onset   Hypertension Mother     Social History   Socioeconomic History   Marital status: Married    Spouse name: Not on file   Number of children: Not on file   Years of education: Not on file   Highest education level: Not on file  Occupational History   Not on file  Tobacco Use   Smoking status: Never   Smokeless tobacco: Never  Substance and Sexual Activity   Alcohol use: Never   Drug use: Never   Sexual activity: Yes  Other Topics Concern   Not on file  Social History Narrative   Not on file   Social Determinants of Health   Financial Resource Strain: Not on file  Food Insecurity: Not on file  Transportation Needs: Not on file  Physical Activity: Not on file  Stress: Not on file  Social Connections: Not on file    No Known Allergies  Outpatient Medications Prior to Visit  Medication Sig Dispense Refill   benzonatate (TESSALON) 100 MG capsule Take 1 capsule (100 mg total) by mouth 3 (three) times daily as needed for cough. (Patient not taking: Reported on 07/15/2022) 21 capsule 0   fluticasone (FLONASE) 50 MCG/ACT nasal spray Place 2  sprays into both nostrils daily. (Patient not taking: Reported on 07/15/2022) 16 g 6   ibuprofen (ADVIL) 600 MG tablet Take 1 tablet (600 mg total) by mouth every 6 (six) hours as needed for fever or moderate pain. (Patient not taking: Reported on 07/15/2022) 30 tablet 0   No facility-administered medications prior to visit.     ROS Review of Systems  Constitutional:  Negative for activity change and appetite change.  HENT:  Positive for sneezing. Negative for sinus pressure and sore throat.   Eyes:  Positive for discharge, redness and itching. Negative for photophobia and visual disturbance.  Respiratory:  Negative for chest tightness, shortness of breath and wheezing.   Cardiovascular:  Negative for chest pain and palpitations.  Gastrointestinal:  Negative for abdominal distention, abdominal pain and constipation.  Genitourinary: Negative.   Musculoskeletal: Negative.   Psychiatric/Behavioral:  Negative for behavioral problems and dysphoric mood.     Objective:  BP 112/73   Pulse 80   Temp 98 F (36.7 C) (Oral)   Ht 5\' 10"  (1.778 m)   Wt 195 lb (88.5 kg)   SpO2 100%   BMI 27.98 kg/m      07/15/2022    2:09 PM 04/22/2022    9:36 AM 04/22/2022    7:45 AM  BP/Weight  Systolic BP [id number]  Diastolic BP  73 88 90  Wt. (Lbs) 195    BMI 27.98 kg/m2        Physical Exam Constitutional:      Appearance: She is well-developed.  HENT:     Right Ear: Tympanic membrane normal.     Left Ear: Tympanic membrane normal.     Mouth/Throat:     Mouth: Mucous membranes are moist.  Cardiovascular:     Rate and Rhythm: Normal rate.     Heart sounds: Normal heart sounds. No murmur heard. Pulmonary:     Effort: Pulmonary effort is normal.     Breath sounds: Normal breath sounds. No wheezing or rales.  Chest:     Chest wall: No tenderness.  Abdominal:     General: Bowel sounds are normal. There is no distension.     Palpations: Abdomen is soft. There is no mass.     Tenderness:  There is no abdominal tenderness.  Musculoskeletal:        General: Normal range of motion.     Right lower leg: No edema.     Left lower leg: No edema.  Neurological:     Mental Status: She is alert and oriented to person, place, and time.  Psychiatric:        Mood and Affect: Mood normal.        Latest Ref Rng & Units 04/21/2022    4:58 PM 10/15/2021    4:40 PM 06/05/2020    7:54 PM  CMP  Glucose 70 - 99 mg/dL 89  121  100   BUN 6 - 20 mg/dL 9  12  11    Creatinine 0.44 - 1.00 mg/dL 0.70  0.81  0.77   Sodium 135 - 145 mmol/L 141  138  138   Potassium 3.5 - 5.1 mmol/L 4.2  3.6  3.9   Chloride 98 - 111 mmol/L 108  101  105   CO2 22 - 32 mmol/L 28  24  25    Calcium 8.9 - 10.3 mg/dL 9.1  9.6  8.9   Total Protein 6.0 - 8.5 g/dL  7.1  7.1   Total Bilirubin 0.0 - 1.2 mg/dL  0.3  0.6   Alkaline Phos 44 - 121 IU/L  115  87   AST 0 - 40 IU/L  14  15   ALT 0 - 32 IU/L  14  10     Lipid Panel  No results found for: "CHOL", "TRIG", "HDL", "CHOLHDL", "VLDL", "LDLCALC", "LDLDIRECT"  CBC    Component Value Date/Time   WBC 5.5 04/21/2022 1658   RBC 4.60 04/21/2022 1658   HGB 12.6 04/21/2022 1658   HCT 38.6 04/21/2022 1658   PLT 203 04/21/2022 1658   MCV 83.9 04/21/2022 1658   MCH 27.4 04/21/2022 1658   MCHC 32.6 04/21/2022 1658   RDW 15.1 04/21/2022 1658   LYMPHSABS 1.9 04/21/2022 1658   MONOABS 0.5 04/21/2022 1658   EOSABS 0.1 04/21/2022 1658   BASOSABS 0.0 04/21/2022 1658    Lab Results  Component Value Date   HGBA1C 5.7 (H) 10/15/2021    Assessment & Plan:  1. Pterygium of left eye - Ambulatory referral to Ophthalmology  2. Allergic conjunctivitis of both eyes - olopatadine (PATANOL) 0.1 % ophthalmic solution; Place 1 drop into the left eye 2 (two) times daily.  Dispense: 5 mL; Refill: 2 - cetirizine (ZYRTEC) 10 MG tablet; Take 1 tablet (10 mg total) by mouth daily.  Dispense: 30 tablet; Refill: 1  Meds ordered this encounter  Medications   olopatadine  (PATANOL) 0.1 % ophthalmic solution    Sig: Place 1 drop into the left eye 2 (two) times daily.    Dispense:  5 mL    Refill:  2   cetirizine (ZYRTEC) 10 MG tablet    Sig: Take 1 tablet (10 mg total) by mouth daily.    Dispense:  30 tablet    Refill:  1    Follow-up: Return in about 3 months (around 10/15/2022) for pterygium.       Charlott Rakes, MD, FAAFP. [facility], [place]   07/15/2022, 5:28 PM

## 2022-07-15 NOTE — Progress Notes (Signed)
Left eye redness and stabbing pain.

## 2022-07-15 NOTE — Patient Instructions (Signed)
Pterygium Excision  Pterygium excision is surgery to remove a pterygium, which is a noncancerous (benign) fleshy growth on the front surface of the eye. A pterygium starts on the clear outer tissue of the eye (conjunctiva) and expands onto the clear tissue (cornea) that covers the colored part of the eye (iris). In severe cases, the pterygium may get large enough that it covers the black center of the eye (pupil). You may need this surgery if you have a pterygium that causes discomfort or affects your vision, and other treatments are not working. You may also choose to have this surgery to improve your ability to wear contact lenses or improve the appearance of your eye (cosmetic surgery). Tell a health care provider about: Any allergies you have. All medicines you are taking, including vitamins, herbs, eye drops, creams, and over-the-counter medicines. Any problems you or family members have had with anesthetic medicines. Any bleeding problems you have. Any surgeries you have had, including eye surgery such as photorefractive keratectomy (Pella) or laser-assisted in situ keratomileusis (LASIK). Any medical conditions you have. Whether you are pregnant or may be pregnant. What are the risks? Generally, this is a safe procedure. However, problems may occur, including: The pterygium coming back after surgery, and in rare cases, worse than the original. Eye pain. This is common after surgery but usually goes away after a few weeks. Vision changes, such as blurry vision due to changes in the shape of the eye (astigmatism). A feeling like there is something in your eye. Scarring on the eye (granuloma). What happens before the procedure? When to stop eating and drinking Follow instructions from your health care provider about what you may eat and drink before your procedure. These may include: 8 hours before your procedure Stop eating most foods. Do not eat meat, fried foods, or fatty foods. Eat only  light foods, such as toast or crackers. All liquids are okay except energy drinks and alcohol. 6 hours before your procedure Stop eating. Drink only clear liquids, such as water, clear fruit juice, black coffee, plain tea, and sports drinks. Do not drink energy drinks or alcohol. 2 hours before your procedure Stop drinking all liquids. You may be allowed to take medicines with small sips of water. If you do not follow your health care provider's instructions, your procedure may be delayed or canceled. Medicines Ask your health care provider about: Changing or stopping your regular medicines. This is especially important if you are taking diabetes medicines or blood thinners. Taking medicines such as aspirin and ibuprofen. These medicines can thin your blood. Do not take these medicines unless your health care provider tells you to take them. Taking over-the-counter medicines, vitamins, herbs, and supplements. General instructions If you will be going home right after the procedure, plan to have a responsible adult: Take you home from the hospital or clinic. You will not be allowed to drive. Care for you for the time you are told. What happens during the procedure? An IV may be inserted into one of your veins. You will be given a medicine to numb your eye (local anesthetic). You may also be given a medicine to help you relax (sedative). A device (eyelid speculum) will be placed to hold your eyelids open. The pterygium will be lifted away and removed from your eye. A piece of eye tissue (graft) may be attached to the surface of your eye where the pterygium was removed. This graft may be taken from the conjunctiva in a different area  of your eyeball or amniotic membrane tissue may be used. The graft may be held in place with small stitches that will dissolve over time (absorbable sutures) or with a type of glue, or both. Your eye will be closed and covered with an eye shield. The procedure  may vary among health care providers and hospitals. What happens after the procedure? Your blood pressure, heart rate, breathing rate, and blood oxygen level will be monitored until you leave the hospital or clinic. You will be given pain medicine as needed. You will need to wear your eye shield as told by your health care provider. Do not drive or use machinery until your health care provider approves. Summary A pterygium is a noncancerous (benign) fleshy growth on the front surface of the eye. You may need this surgery if you have a pterygium that causes discomfort or affects your vision. After surgery, your eye will be covered with an eye shield. Wear it as told by your health care provider. This information is not intended to replace advice given to you by your health care provider. Make sure you discuss any questions you have with your health care provider. Document Revised: 12/05/2020 Document Reviewed: 12/05/2020 Elsevier Patient Education  [place].

## 2022-07-22 IMAGING — CR DG CERVICAL SPINE COMPLETE 4+V
5 series · 5 of 5 positions shown · non-contrast
Comparison: None.

CLINICAL DATA: Neck pain.

EXAM:
CERVICAL SPINE - COMPLETE 4+ VIEW

[c-spine lat]
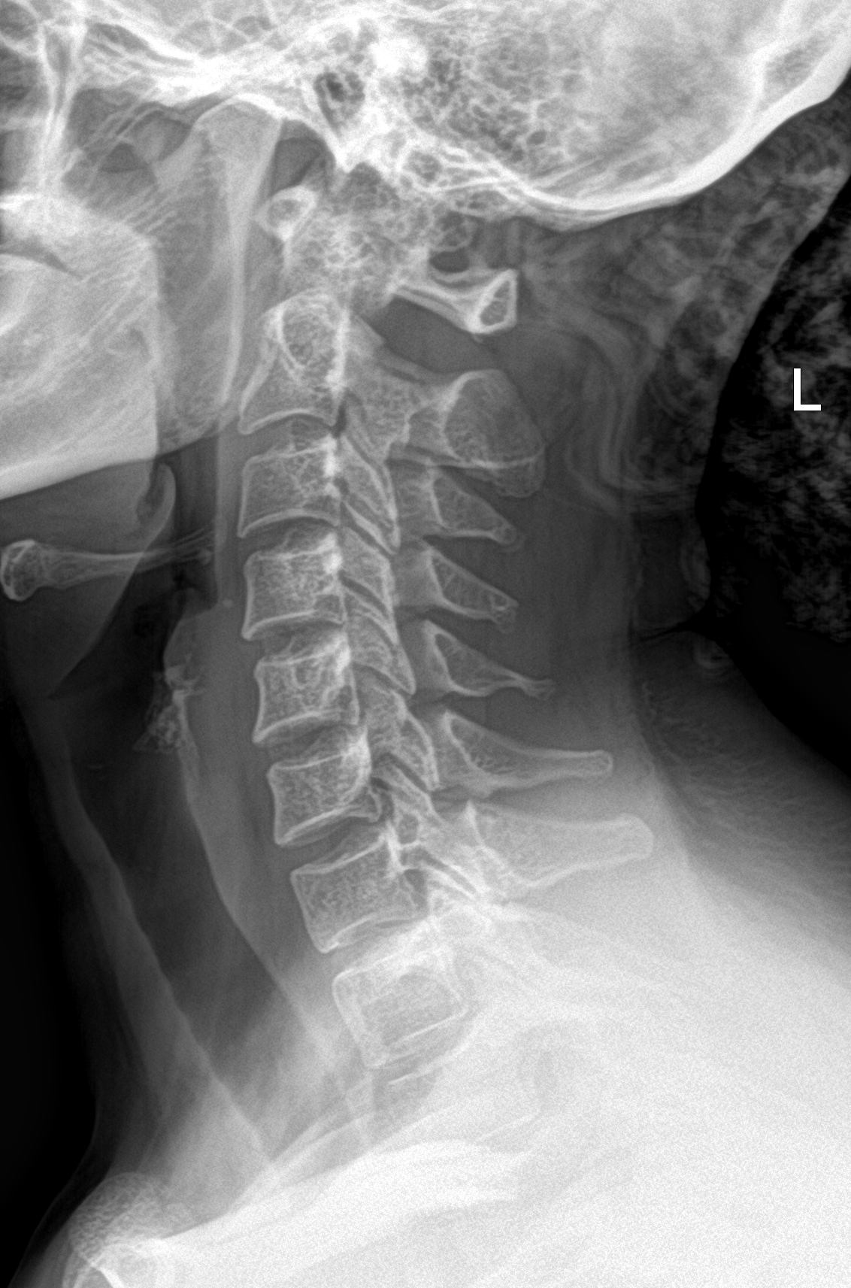

[c-spine obl (1 of 2)]
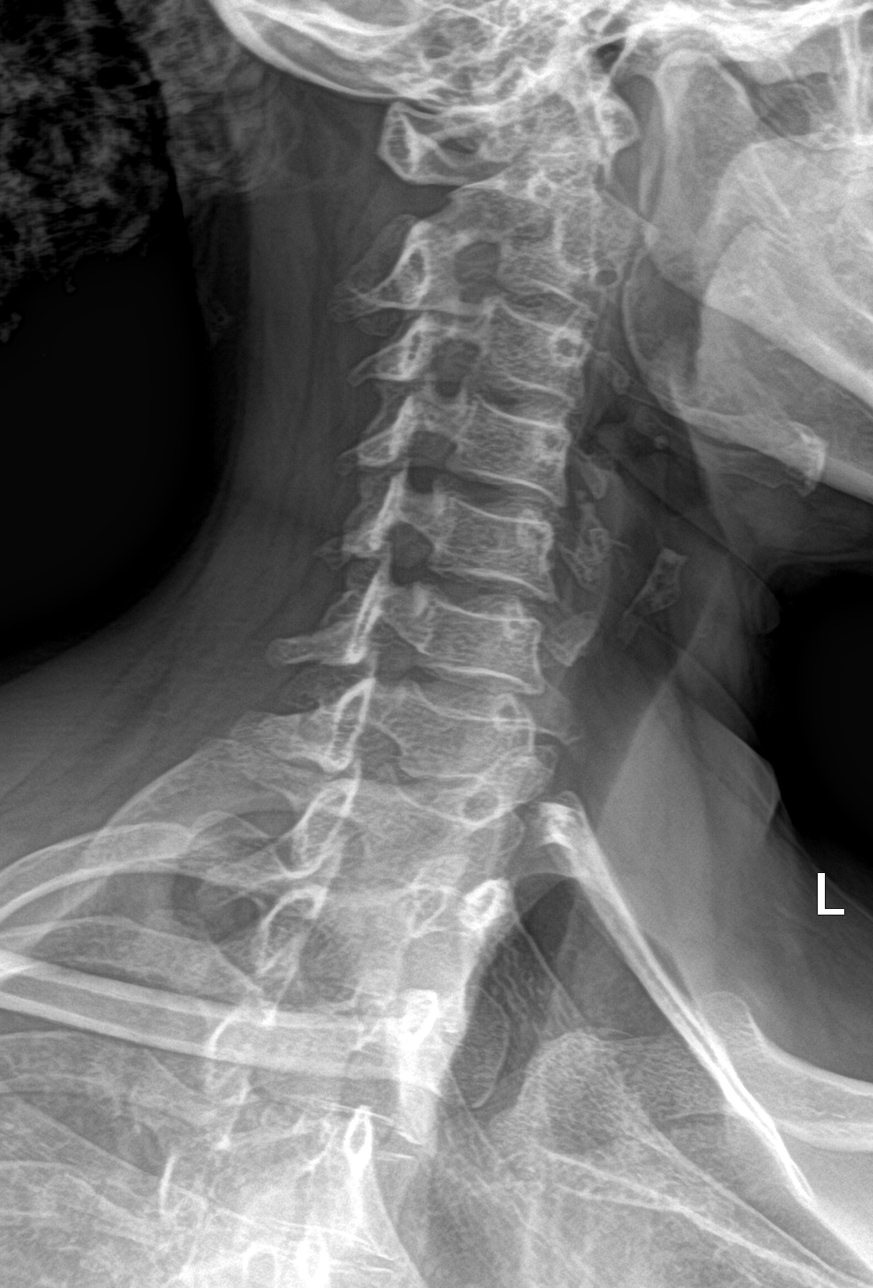

[c-spine obl (2 of 2)]
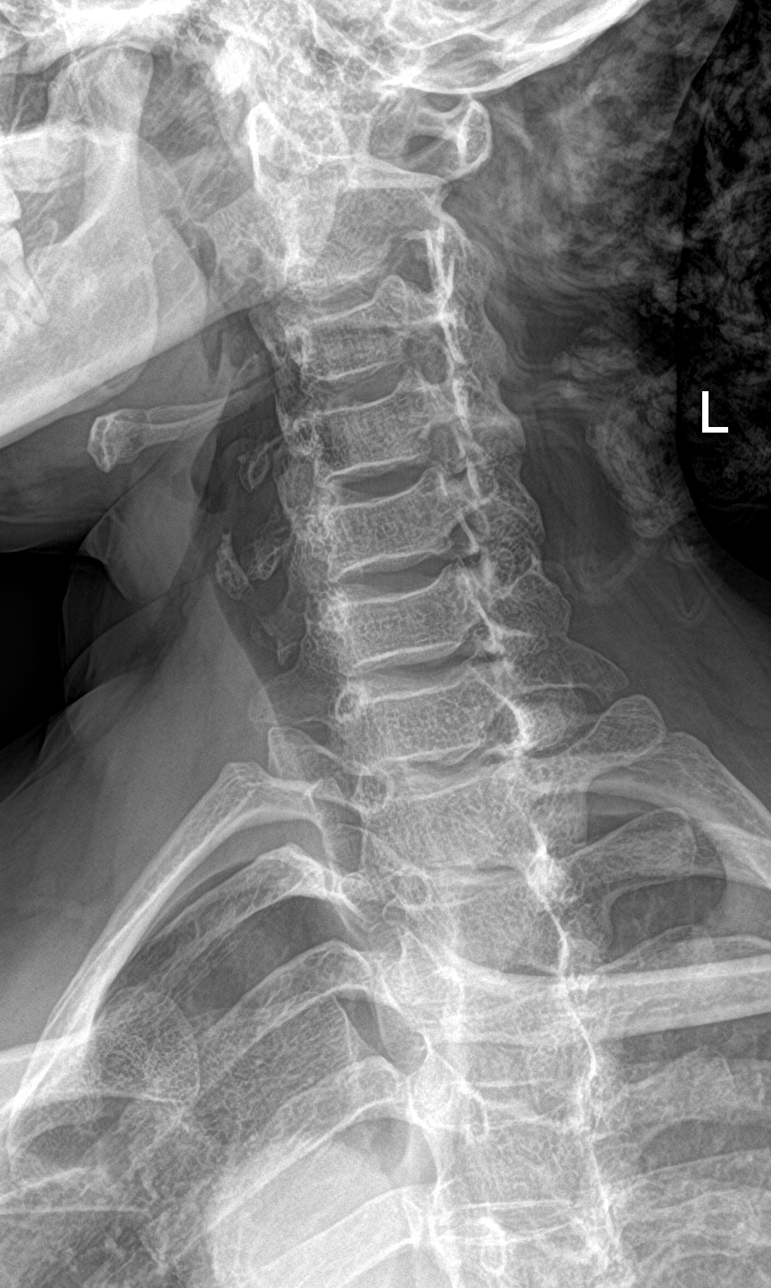

[c-spine ap]
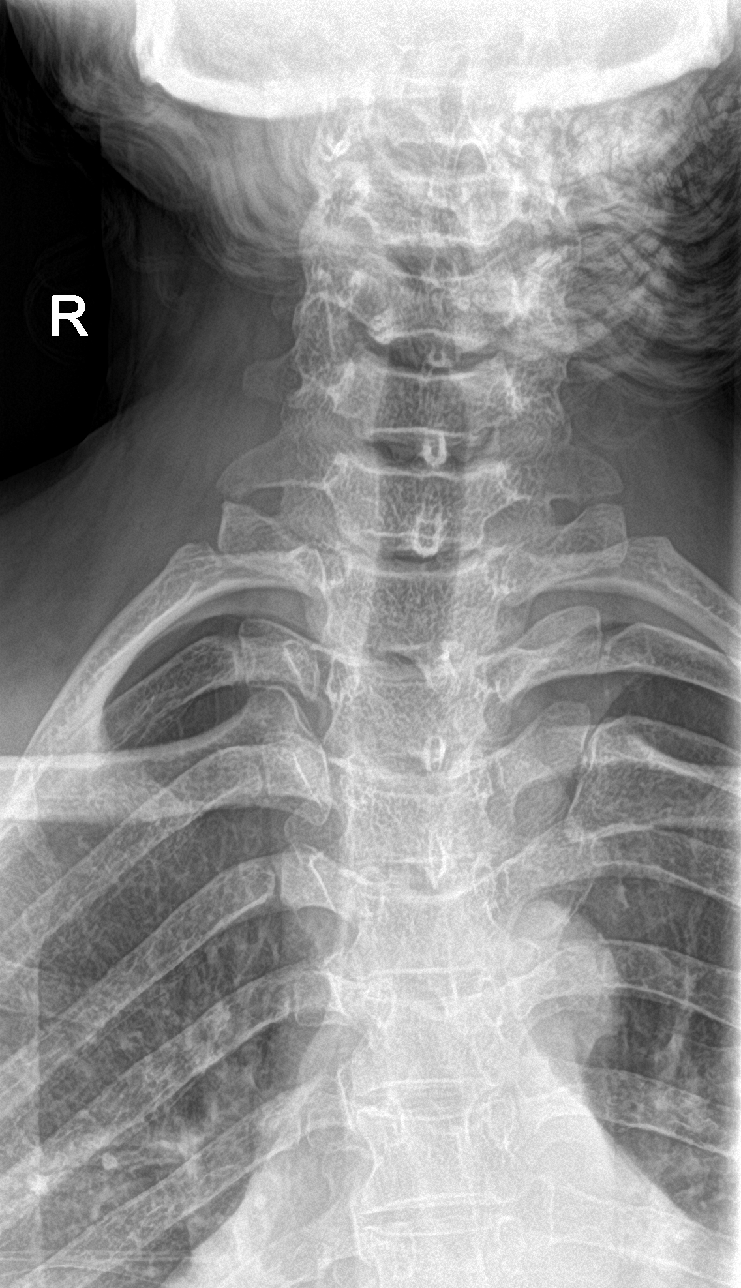

[c-spine open mouth]
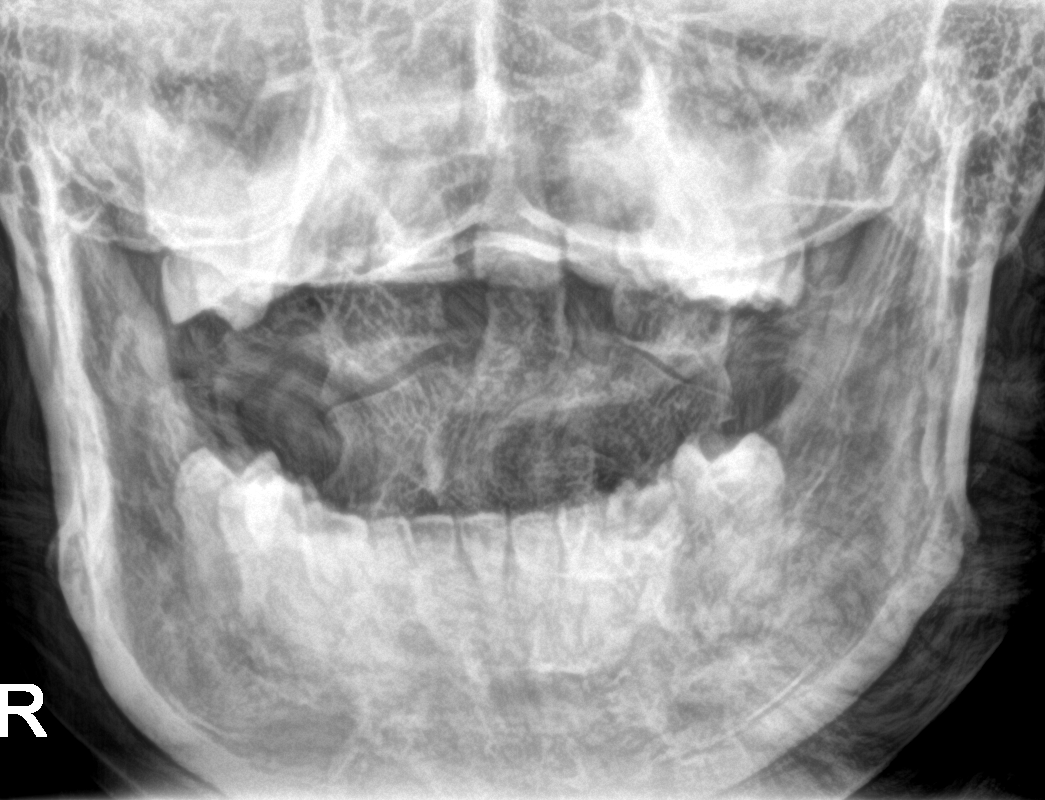

[5 of 5 positions shown; findings below may reference images not displayed]

FINDINGS: There is no acute fracture or subluxation of the cervical spine. The
vertebral body heights and disc spaces are maintained. The
visualized posterior elements and odontoid are intact. There is
anatomic alignment of the lateral masses of C1 and C2. The soft
tissues are unremarkable.
IMPRESSION: Negative cervical spine radiographs.

## 2022-10-19 ENCOUNTER — Ambulatory Visit: Payer: Medicaid Other | Admitting: Family Medicine

## 2022-11-05 ENCOUNTER — Ambulatory Visit: Payer: Medicaid Other | Admitting: Family Medicine

## 2023-02-24 ENCOUNTER — Ambulatory Visit (INDEPENDENT_AMBULATORY_CARE_PROVIDER_SITE_OTHER): Payer: Medicaid Other | Admitting: Primary Care

## 2023-02-24 VITALS — BP 122/86 | HR 60 | Temp 98.4°F | Resp 16

## 2023-02-24 DIAGNOSIS — R0989 Other specified symptoms and signs involving the circulatory and respiratory systems: Secondary | ICD-10-CM | POA: Diagnosis not present

## 2023-02-24 NOTE — Progress Notes (Signed)
  [facility]  Virtual Visit via Telephone Note  I connected with Jacqueline Sullivan, on 02/24/2023 at 12:19 PM  Consent: I discussed the limitations, risks, security and privacy concerns of performing an evaluation and management service by telephone and the availability of in person appointments. I also discussed with the patient that there may be a patient responsible charge related to this service. The patient expressed understanding and agreed to proceed. Jacqueline Sullivan [phone]  Location of Patient: Home  Location of Provider: [facility]   Persons participating in Telemedicine visit: Jacqueline Otoole Wojtaszek Gwinda Passe,  NP   History of Present Illness:  C/O repiratory issues    Past Medical History:  Diagnosis Date   Gastroesophageal reflux disease without esophagitis 10/22/2020   No Known Allergies  Current Outpatient Medications on File Prior to Visit  Medication Sig Dispense Refill   benzonatate (TESSALON) 100 MG capsule Take 1 capsule (100 mg total) by mouth 3 (three) times daily as needed for cough. (Patient not taking: Reported on 07/15/2022) 21 capsule 0   cetirizine (ZYRTEC) 10 MG tablet Take 1 tablet (10 mg total) by mouth daily. 30 tablet 1   fluticasone (FLONASE) 50 MCG/ACT nasal spray Place 2 sprays into both nostrils daily. (Patient not taking: Reported on 07/15/2022) 16 g 6   ibuprofen (ADVIL) 600 MG tablet Take 1 tablet (600 mg total) by mouth every 6 (six) hours as needed for fever or moderate pain. (Patient not taking: Reported on 07/15/2022) 30 tablet 0   olopatadine (PATANOL) 0.1 % ophthalmic solution Place 1 drop into the left eye 2 (two) times daily. 5 mL 2   No current facility-administered medications on file prior to visit.     I discussed the assessment and treatment plan with the patient. The patient was provided an opportunity to ask questions and all were answered. The patient  agreed with the plan and demonstrated an understanding of the instructions.   The patient was advised to call back or seek an in-person evaluation if the symptoms worsen or if the condition fails to improve as anticipated.  Tried calling patient's 3 times with an interpreter phone does not allow to leave message.  She was seen for respiratory problems and given a COVID test which was negative.   I provided 0 minutes total of non-face-to-face time during this encounter including median intraservice time, reviewing previous notes, investigations, ordering medications, medical decision making, coordinating care and patient verbalized understanding at the end of the visit.    This note has been created with Education officer, environmental. Any transcriptional errors are unintentional.   Jacqueline Sessions, NP 02/24/2023, 12:19 PM

## 2023-07-08 ENCOUNTER — Ambulatory Visit: Attending: Physician Assistant | Admitting: Physician Assistant

## 2023-07-08 VITALS — BP 122/82 | HR 78 | Temp 97.9°F | Ht 70.0 in | Wt 193.2 lb

## 2023-07-08 DIAGNOSIS — R202 Paresthesia of skin: Secondary | ICD-10-CM

## 2023-07-08 DIAGNOSIS — R6889 Other general symptoms and signs: Secondary | ICD-10-CM | POA: Diagnosis not present

## 2023-07-08 DIAGNOSIS — Z758 Other problems related to medical facilities and other health care: Secondary | ICD-10-CM

## 2023-07-08 DIAGNOSIS — E559 Vitamin D deficiency, unspecified: Secondary | ICD-10-CM

## 2023-07-08 DIAGNOSIS — R5383 Other fatigue: Secondary | ICD-10-CM

## 2023-07-08 DIAGNOSIS — R52 Pain, unspecified: Secondary | ICD-10-CM | POA: Diagnosis not present

## 2023-07-08 DIAGNOSIS — Z603 Acculturation difficulty: Secondary | ICD-10-CM

## 2023-07-08 DIAGNOSIS — H0264 Xanthelasma of left upper eyelid: Secondary | ICD-10-CM

## 2023-07-08 DIAGNOSIS — R7303 Prediabetes: Secondary | ICD-10-CM | POA: Diagnosis not present

## 2023-07-08 DIAGNOSIS — H0261 Xanthelasma of right upper eyelid: Secondary | ICD-10-CM

## 2023-07-08 NOTE — Patient Instructions (Signed)
 Please drink 64 to 80 ounces water daily

## 2023-07-08 NOTE — Progress Notes (Signed)
 Patient ID: Jacqueline Sullivan, female   DOB: 1981/03/28, 43 y.o.   MRN: [id number]     Jacqueline Sullivan, is a 43 y.o. female  [id number]  [id number]  DOB - August 02, 1980  Chief Complaint  Patient presents with   Hypertension       Subjective:   Jacqueline Sullivan is a 43 y.o. female here today for multiple issues.  She has a h/o vitamin D and actually had to have injections of vitamin D when she was still in [place].  She has been in [place] for 3 or 4 years.    Periods are irregular.  No BC but wants to see gyn and get up to date on everything.    She is also c/o all over body pain that is worse in cold weather.  No tick bites.  No fevers.  This has been going on since coming to [place].   She also c/o paresthesias that are all over her body.  This is not a new complaint but it is more prevalent than before.    She has began to notice that certain foods cause her to get a HA.  Pastas, various juices.  No vision changes.  HA go away with tylenol.    FH;  Mom had DM and dyslipidemia     No problems updated.  ALLERGIES: No Known Allergies  PAST MEDICAL HISTORY: Past Medical History:  Diagnosis Date   Gastroesophageal reflux disease without esophagitis 10/22/2020    MEDICATIONS AT HOME: Prior to Admission medications   Medication Sig Start Date End Date Taking? Authorizing Provider  benzonatate (TESSALON) 100 MG capsule Take 1 capsule (100 mg total) by mouth 3 (three) times daily as needed for cough. Patient not taking: Reported on 07/15/2022 04/22/22   Lonell Grandchild, MD  cetirizine (ZYRTEC) 10 MG tablet Take 1 tablet (10 mg total) by mouth daily. 07/15/22   Hoy Register, MD  fluticasone (FLONASE) 50 MCG/ACT nasal spray Place 2 sprays into both nostrils daily. Patient not taking: Reported on 07/15/2022 10/15/21   Anders Simmonds, PA-C  ibuprofen (ADVIL) 600 MG tablet Take 1 tablet (600 mg total) by mouth every 6 (six) hours as needed for fever or moderate pain. Patient not taking:  Reported on 07/15/2022 04/22/22   Lonell Grandchild, MD  olopatadine (PATANOL) 0.1 % ophthalmic solution Place 1 drop into the left eye 2 (two) times daily. 07/15/22   Hoy Register, MD    ROS: Neg HEENT Neg resp Neg cardiac Neg GI Neg GU Neg psych  Objective:   Vitals:   07/08/23 1037  BP: 122/82  Pulse: 78  Temp: 97.9 F (36.6 C)  TempSrc: Oral  SpO2: 99%  Weight: 193 lb 3.2 oz (87.6 kg)  Height: 5\' 10"  (1.778 m)   Exam General appearance : Awake, alert, not in any distress. Speech Clear. Not toxic looking HEENT: Atraumatic and Normocephalic, pupils equally reactive to light and accomodation, mucus membranes dry(only drinks 2 small bottles water daily).  Xanthelasma noted B upper eylids Neck: Supple, no JVD. No cervical lymphadenopathy.  Chest: Good air entry bilaterally, CTAB.  No rales/rhonchi/wheezing CVS: S1 S2 regular, no murmurs.  Extremities: B/L Lower Ext shows no edema, both legs are warm to touch Neurology: Awake alert, and oriented X 3, CN II-XII intact, Non focal Skin: No Rash  Data Review Lab Results  Component Value Date   HGBA1C 5.7 (H) 10/15/2021    Assessment & Plan   1. Paresthesia (Primary) Increase water intake  -  Comprehensive metabolic panel - E45 and Folate Panel - TSH  2. Whole body pain - CBC with Differential - Comprehensive metabolic panel  3. Prediabetes with FH Diabetes Work at a goal of eliminating sugary drinks, candy, desserts, sweets, refined sugars, processed foods, and white carbohydrates. - Hemoglobin A1c  4. Temperature intolerance Tsh and other labs  5. Language barrier [employer] "[place]us[place]" interpreters used and additional time performing visit was required.   6. Fatigue, unspecified type - B12 and Folate Panel - TSH  7. Vitamin D deficiency H/o deficiency with injections in [place](clarified D and NOT B - Vitamin D, 25-hydroxy - TSH  8. 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  The patient was given clear instructions to go to ER or return to medical center if symptoms don't improve, worsen or new problems develop. The patient verbalized understanding. The patient was told to call to get lab results if they haven't heard anything in the next week.      Georgian Co, PA-C [facility] and [facility] [place], [place] [phone]   07/08/2023, 11:18 AM

## 2023-07-09 ENCOUNTER — Encounter: Payer: Self-pay | Admitting: Physician Assistant

## 2023-07-09 ENCOUNTER — Other Ambulatory Visit: Payer: Self-pay | Admitting: Physician Assistant

## 2023-07-09 DIAGNOSIS — H0264 Xanthelasma of left upper eyelid: Secondary | ICD-10-CM

## 2023-07-09 DIAGNOSIS — E559 Vitamin D deficiency, unspecified: Secondary | ICD-10-CM

## 2023-07-09 DIAGNOSIS — E785 Hyperlipidemia, unspecified: Secondary | ICD-10-CM

## 2023-07-09 LAB — CBC WITH DIFFERENTIAL/PLATELET
Basophils Absolute: 0 10*3/uL (ref 0.0–0.2)
Basos: 0 %
EOS (ABSOLUTE): 0 10*3/uL (ref 0.0–0.4)
Eos: 1 %
Hematocrit: 40.3 % (ref 34.0–46.6)
Hemoglobin: 13.1 g/dL (ref 11.1–15.9)
Immature Grans (Abs): 0 10*3/uL (ref 0.0–0.1)
Immature Granulocytes: 0 %
Lymphocytes Absolute: 1.8 10*3/uL (ref 0.7–3.1)
Lymphs: 37 %
MCH: 27.6 pg (ref 26.6–33.0)
MCHC: 32.5 g/dL (ref 31.5–35.7)
MCV: 85 fL (ref 79–97)
Monocytes Absolute: 0.4 10*3/uL (ref 0.1–0.9)
Monocytes: 9 %
Neutrophils Absolute: 2.7 10*3/uL (ref 1.4–7.0)
Neutrophils: 53 %
Platelets: 219 10*3/uL (ref 150–450)
RBC: 4.75 x10E6/uL (ref 3.77–5.28)
RDW: 15.2 % (ref 11.7–15.4)
WBC: 5 10*3/uL (ref 3.4–10.8)

## 2023-07-09 LAB — COMPREHENSIVE METABOLIC PANEL
ALT: 15 IU/L (ref 0–32)
AST: 16 IU/L (ref 0–40)
Albumin: 4.5 g/dL (ref 3.9–4.9)
Alkaline Phosphatase: 145 IU/L — ABNORMAL HIGH (ref 44–121)
BUN/Creatinine Ratio: 19 (ref 9–23)
BUN: 14 mg/dL (ref 6–24)
Bilirubin Total: 0.4 mg/dL (ref 0.0–1.2)
CO2: 21 mmol/L (ref 20–29)
Calcium: 9.2 mg/dL (ref 8.7–10.2)
Chloride: 106 mmol/L (ref 96–106)
Creatinine, Ser: 0.74 mg/dL (ref 0.57–1.00)
Globulin, Total: 2.9 g/dL (ref 1.5–4.5)
Glucose: 87 mg/dL (ref 70–99)
Potassium: 4 mmol/L (ref 3.5–5.2)
Sodium: 144 mmol/L (ref 134–144)
Total Protein: 7.4 g/dL (ref 6.0–8.5)
eGFR: 102 mL/min/{1.73_m2} (ref >59)

## 2023-07-09 LAB — HEMOGLOBIN A1C
Est. average glucose Bld gHb Est-mCnc: 120 mg/dL
Hgb A1c MFr Bld: 5.8 % — ABNORMAL HIGH (ref 4.8–5.6)

## 2023-07-09 LAB — VITAMIN D 25 HYDROXY (VIT D DEFICIENCY, FRACTURES): Vit D, 25-Hydroxy: 16 ng/mL — ABNORMAL LOW (ref 30.0–100.0)

## 2023-07-09 LAB — LIPID PANEL
Chol/HDL Ratio: 3.6 ratio (ref 0.0–4.4)
Cholesterol, Total: 220 mg/dL — ABNORMAL HIGH (ref 100–199)
HDL: 61 mg/dL (ref >39)
LDL Chol Calc (NIH): 148 mg/dL — ABNORMAL HIGH (ref 0–99)
Triglycerides: 62 mg/dL (ref 0–149)
VLDL Cholesterol Cal: 11 mg/dL (ref 5–40)

## 2023-07-09 LAB — B12 AND FOLATE PANEL
Folate: >20 ng/mL (ref >3.0)
Vitamin B-12: 545 pg/mL (ref 232–1245)

## 2023-07-09 LAB — TSH: TSH: 0.775 u[IU]/mL (ref 0.450–4.500)

## 2023-07-09 MED ORDER — VITAMIN D (ERGOCALCIFEROL) 1.25 MG (50000 UNIT) PO CAPS: 50000.0000 [IU] | ORAL_CAPSULE | ORAL | 0 refills | Status: DC

## 2023-07-09 MED ORDER — ATORVASTATIN CALCIUM 10 MG PO TABS: 10.0000 mg | ORAL_TABLET | Freq: Every day | ORAL | 3 refills | Status: AC

## 2023-11-04 ENCOUNTER — Ambulatory Visit: Payer: Self-pay

## 2023-11-04 NOTE — Telephone Encounter (Signed)
 FYI Only or Action Required?: Action required by provider: request for appointment.  Patient was last seen in primary care on 07/08/2023 by Danton Jon HERO, PA-C.  Called Nurse Triage reporting Generalized Body Aches.  Symptoms began several days ago.  Interventions attempted: Rest, hydration, or home remedies.  Symptoms are: unchanged.  Triage Disposition: See HCP Within 4 Hours (Or PCP Triage)  Patient/caregiver understands and will follow disposition?: YesCopied from CRM [id number]. Topic: Clinical - Red Word Triage >> Nov 04, 2023 11:06 AM Silvana PARAS wrote: Red Word that prompted transfer to Nurse Triage: Fever and body aches. Reason for Disposition  [1] SEVERE pain (e.g., excruciating, unable to do any normal activities) AND [2] not improved 2 hours after pain medicine  Answer Assessment - Initial Assessment Questions 1. ONSET: When did the muscle aches or body pains start?      2 days ago  2. LOCATION: What part of your body is hurting? (e.g., entire body, arms, legs)      Whole body 3. SEVERITY: How bad is the pain? (Scale 1-10; or mild, moderate, severe)     Moderate-severe 4. CAUSE: What do you think is causing the pains?     Not sure 5. FEVER: Do you have a fever? If Yes, ask: What is your temperature, how was it measured, and  when did it start?      Not sure 6. OTHER SYMPTOMS: Do you have any other symptoms? (e.g., chest pain, cold or flu symptoms, rash, weakness, weight loss)     fever      Son Liza called on behalf on pt.  Son is translating for pt. Phone reception was terrible.Liza stated her whole body hurts. Pt feels her blood sugar is dropping/rising but doesn't have a away to check it.  Lots of in/out during conversation. RN expressed need for pt to go to UC. Liza stated he understood.  Protocols used: Muscle Aches and Body Pain-A-AH

## 2023-11-04 NOTE — Telephone Encounter (Signed)
 Noted

## 2024-01-10 ENCOUNTER — Encounter: Payer: Self-pay | Admitting: Family Medicine

## 2024-01-10 ENCOUNTER — Ambulatory Visit: Attending: Family Medicine | Admitting: Family Medicine

## 2024-01-10 VITALS — BP 126/81 | HR 64 | Ht 70.0 in | Wt 190.0 lb

## 2024-01-10 DIAGNOSIS — Z13220 Encounter for screening for lipoid disorders: Secondary | ICD-10-CM | POA: Diagnosis not present

## 2024-01-10 DIAGNOSIS — N939 Abnormal uterine and vaginal bleeding, unspecified: Secondary | ICD-10-CM

## 2024-01-10 DIAGNOSIS — R7303 Prediabetes: Secondary | ICD-10-CM

## 2024-01-10 DIAGNOSIS — Z1159 Encounter for screening for other viral diseases: Secondary | ICD-10-CM | POA: Diagnosis not present

## 2024-01-10 DIAGNOSIS — Z136 Encounter for screening for cardiovascular disorders: Secondary | ICD-10-CM

## 2024-01-10 NOTE — Progress Notes (Signed)
 Subjective:  Patient ID: Jacqueline Sullivan, female    DOB: [birth year]  Age: 43 y.o. MRN: [id number]  CC: Medical Management of Chronic Issues (Referral to OBGYN for irregular periods.)     Discussed the use of AI scribe software for clinical note transcription with the patient, who gave verbal consent to proceed.  History of Present Illness Jacqueline Sullivan is a 43 year old female with a history of of Prediabetes who presents with irregular menstrual periods.  For the past six months, she has experienced irregular menstrual cycles, with periods occurring two to three times a month or skipping months entirely. Currently, she is not menstruating. Initially, bleeding was heavy but has since normalized. Her menstrual cycles were regular prior to this period. She has no cramping during her periods.  She used birth control pills for two years after arriving in the [place]  three years ago but discontinued them a year ago.   She is concerned about previously reported high cholesterol levels but it is unclear if this was a fasting sample.    Past Medical History:  Diagnosis Date   Gastroesophageal reflux disease without esophagitis 10/22/2020    No past surgical history on file.  Family History  Problem Relation Age of Onset   Hypertension Mother     Social History   Socioeconomic History   Marital status: Married    Spouse name: Not on file   Number of children: Not on file   Years of education: Not on file   Highest education level: Not on file  Occupational History   Not on file  Tobacco Use   Smoking status: Never   Smokeless tobacco: Never  Substance and Sexual Activity   Alcohol use: Never   Drug use: Never   Sexual activity: Yes  Other Topics Concern   Not on file  Social History Narrative   Not on file   Social Drivers of Health   Financial Resource Strain: Not on file  Food Insecurity: Low Risk  (08/03/2023)   Received from [facility]   Hunger Vital  Sign    Within the past 12 months, you worried that your food would run out before you got money to buy more: Never true    Within the past 12 months, the food you bought just didn't last and you didn't have money to get more. : Never true  Transportation Needs: No Transportation Needs (08/03/2023)   Received from [facility]    In the past 12 months, has lack of reliable transportation kept you from medical appointments, meetings, work or from getting things needed for daily living? : No  Physical Activity: Not on file  Stress: Not on file  Social Connections: Not on file    No Known Allergies  Outpatient Medications Prior to Visit  Medication Sig Dispense Refill   atorvastatin  (LIPITOR) 10 MG tablet Take 1 tablet (10 mg total) by mouth daily. 90 tablet 3   cetirizine  (ZYRTEC ) 10 MG tablet Take 1 tablet (10 mg total) by mouth daily. 30 tablet 1   olopatadine  (PATANOL) 0.1 % ophthalmic solution Place 1 drop into the left eye 2 (two) times daily. 5 mL 2   Vitamin D , Ergocalciferol , (DRISDOL ) 1.25 MG (50000 UNIT) CAPS capsule Take 1 capsule (50,000 Units total) by mouth every 7 (seven) days. 16 capsule 0   benzonatate  (TESSALON ) 100 MG capsule Take 1 capsule (100 mg total) by mouth 3 (three) times daily as needed  for cough. (Patient not taking: Reported on 01/10/2024) 21 capsule 0   fluticasone  (FLONASE ) 50 MCG/ACT nasal spray Place 2 sprays into both nostrils daily. (Patient not taking: Reported on 01/10/2024) 16 g 6   ibuprofen  (ADVIL ) 600 MG tablet Take 1 tablet (600 mg total) by mouth every 6 (six) hours as needed for fever or moderate pain. (Patient not taking: Reported on 01/10/2024) 30 tablet 0   No facility-administered medications prior to visit.     ROS Review of Systems  Constitutional:  Negative for activity change and appetite change.  HENT:  Negative for sinus pressure and sore throat.   Respiratory:  Negative for chest tightness, shortness of breath and  wheezing.   Cardiovascular:  Negative for chest pain and palpitations.  Gastrointestinal:  Negative for abdominal distention, abdominal pain and constipation.  Genitourinary:  Positive for menstrual problem.  Musculoskeletal: Negative.   Psychiatric/Behavioral:  Negative for behavioral problems and dysphoric mood.     Objective:  BP 126/81   Pulse 64   Ht 5' 10 (1.778 m)   Wt 190 lb (86.2 kg)   SpO2 100%   BMI 27.26 kg/m      01/10/2024   10:54 AM 07/08/2023   10:37 AM 02/24/2023   11:18 AM  BP/Weight  Systolic BP 126 122 122  Diastolic BP 81 82 86  Wt. (Lbs) 190 193.2   BMI 27.26 kg/m2 27.72 kg/m2       Physical Exam Constitutional:      Appearance: She is well-developed.  Cardiovascular:     Rate and Rhythm: Normal rate.     Heart sounds: Normal heart sounds. No murmur heard. Pulmonary:     Effort: Pulmonary effort is normal.     Breath sounds: Normal breath sounds. No wheezing or rales.  Chest:     Chest wall: No tenderness.  Abdominal:     General: Bowel sounds are normal. There is no distension.     Palpations: Abdomen is soft. There is no mass.     Tenderness: There is no abdominal tenderness.  Musculoskeletal:        General: Normal range of motion.     Right lower leg: No edema.     Left lower leg: No edema.  Neurological:     Mental Status: She is alert and oriented to person, place, and time.  Psychiatric:        Mood and Affect: Mood normal.        Latest Ref Rng & Units 07/08/2023   11:53 AM 04/21/2022    4:58 PM 10/15/2021    4:40 PM  CMP  Glucose 70 - 99 mg/dL 87  89  878   BUN 6 - 24 mg/dL 14  9  12    Creatinine 0.57 - 1.00 mg/dL 9.25  9.29  9.18   Sodium 134 - 144 mmol/L 144  141  138   Potassium 3.5 - 5.2 mmol/L 4.0  4.2  3.6   Chloride 96 - 106 mmol/L 106  108  101   CO2 20 - 29 mmol/L 21  28  24    Calcium  8.7 - 10.2 mg/dL 9.2  9.1  9.6   Total Protein 6.0 - 8.5 g/dL 7.4   7.1   Total Bilirubin 0.0 - 1.2 mg/dL 0.4   0.3    Alkaline Phos 44 - 121 IU/L 145   115   AST 0 - 40 IU/L 16   14   ALT 0 - 32 IU/L 15  14     Lipid Panel     Component Value Date/Time   CHOL 220 (H) 07/08/2023 1153   TRIG 62 07/08/2023 1153   HDL 61 07/08/2023 1153   CHOLHDL 3.6 07/08/2023 1153   LDLCALC 148 (H) 07/08/2023 1153    CBC    Component Value Date/Time   WBC 5.0 07/08/2023 1153   WBC 5.5 04/21/2022 1658   RBC 4.75 07/08/2023 1153   RBC 4.60 04/21/2022 1658   HGB 13.1 07/08/2023 1153   HCT 40.3 07/08/2023 1153   PLT 219 07/08/2023 1153   MCV 85 07/08/2023 1153   MCH 27.6 07/08/2023 1153   MCH 27.4 04/21/2022 1658   MCHC 32.5 07/08/2023 1153   MCHC 32.6 04/21/2022 1658   RDW 15.2 07/08/2023 1153   LYMPHSABS 1.8 07/08/2023 1153   MONOABS 0.5 04/21/2022 1658   EOSABS 0.0 07/08/2023 1153   BASOSABS 0.0 07/08/2023 1153    Lab Results  Component Value Date   HGBA1C 5.8 (H) 07/08/2023    The 10-year ASCVD risk score (Arnett DK, et al., 2019) is: 0.7%   Values used to calculate the score:     Age: 1 years     Clincally relevant sex: Female     Is Non-Hispanic African American: No     Diabetic: No     Tobacco smoker: No     Systolic Blood Pressure: 126 mmHg     Is BP treated: No     HDL Cholesterol: 61 mg/dL     Total Cholesterol: 220 mg/dL     Assessment & Plan Abnormal uterine bleeding Irregular periods for six months. Possible uterine fibroids. - Order pelvic ultrasound to evaluate for fibroids or other uterine abnormalities. - Refer to gynecology for further evaluation and management.  Prediabetes Diagnosed with prediabetes with A1c of 6.8. Risk of progression to diabetes without dietary changes. - Advise to avoid sweets and carbohydrates. - Recheck blood sugar levels at next visit.  Encounter for lipid screening for Cardiovascular Disease Previous elevated cholesterol levels. Accurate results require fasting. - Schedule fasting lipid panel for Wednesday morning before  eating.    Healthcare maintenance Referring to GYN for abnormal uterine bleed hence PAP will be done there Mammo at next visit Screening for viral disease - Hep C, HIV, Hep B titer ordered  No orders of the defined types were placed in this encounter.   Follow-up: Return in about 6 months (around 07/09/2024), or if symptoms worsen or fail to improve, for Chronic medical conditions.       Corrina Sabin, MD, FAAFP. [facility] [place], [place] [phone]   01/10/2024, 12:18 PM

## 2024-01-10 NOTE — Patient Instructions (Signed)
 ????? ???? ????? ?? ??? ?????: ??????? ??????? ??? ??????? Heavy or Long-Lasting Menstrual Periods: What to Know ????? ???? ???? ??????? ?????? ????? ?????? ?? ?????? ???????? ????? ?? ????? ????? ????? ????? ????? ?????? ??? ?????. ??? ??? ??????? ???? ??????? ??????? ????? ?? ????? ???? ???? ?? ???????.  ???? ????? ???? ??????? ??? ?????? ?????? ????????? ????? ?? ???????? ???? ???? ?????? ??????? ??? ??????. ?? ????? ??? ??????? ????? ??????? ??????? ???? ?????? ?? ???:  ??? ????? ?? ????? (????? ????? ?? ????? ?????)? ???? ??? ??????? ???? ???????.  ?????? ?? ???????? ???? ???? ?????? ????? ??: ? ???? ?? ????? ????? (???? ???). ? ???? ???? ????? (?????? ????? ???? ?????? ????? ????? ????? ????????).   ??? ??????? ?? ????? ????? ???? ??? ?? ????.  ?????? ????? ??? ???? (????) ???? ????? ??????.  ??? ???????.  ??????? ?????. ????? ??? ??????? ???? ?????? ??? ????? ?? ??? ???????. ?? ????? ????? ?????? ????? ?????? ??????? ???? ?????? ??? ???????? ?????? ?????. ?? ?????? ??? ?????? ?? ???????? ???? ????? ??? ?????? ?? ???:  ??? ????. ??? ?????? ???? ????? ?????? ???:  ? ????? ?????? ?????? ?? ??????? ??????? ?? ????? ?? ??????. ? ??????? ???? ???? ?? ????? ????? ?? ????? ????. ? ????????? ????? ?????? ?????? ?????? ?? ???????.  ???? ??? ?? ????? ???? ?? ???? ????? (2.5 ??).  ??????? ????? ????? ?? 7 ????. ??? ????? ??? ??????? ???? ????? ??? ?????? ???????? ??? ?????? ?????? ???????? ???? ?????? ????? ???????? ????? ??????? ?????? ?? ????? ?????? ???????. ???? ?? ????? ?? ????? ????????? ????? ?????:  ?????? ????.  ?????? ??? ( ???? ??? ?????)  ??????. ??? ????? ?????? ??? ???? ????? ?? ?????? ???? ???? ????? ??????.  ??? ???????? ??? ??????? ????? ????????? ???????.  ??? ????? ???????? ????? ??? ???? ?????? (????? ?????). ??? ?????? ??? ???????  ??? ?????? ?? ?? ????? ??????. ???? ??? ?????? ??? ????? ??? ??? ????? ??????? ??????:  ????? ???? ??? ????? ?? ????? ???? ????  (IUD).  ?????? ????????.  ???? ???? ?? ?????? ??? ????????????.  ???? ?????? ??? ??? ????? ?? ?????.  ????? ???? ???? ?????.  ???? ???????.  ???????? ???????. ???? ??? ??? ????? ???? ????. ??? ?? ???? ???????? ??? ???? ?????? ??? ????? ?????. ???? ????? ????? ????:  ??????? ??? ?? ?????? ?????.  ??????? ??????? ?? ??????? ??? ???? ??????? ????? ?? ??????? ?????.  ??????? ????? ????? ????????.  ??????? ????? ????????. ??????? ??????? ??????? ?????? ??????? ??? ????? ???????? ??????? ???. ???? ???????? ?? ???? ?? ??? ??????? ?????? ???? ??????? ?????? ??? ???? ?????? ?? ????? ??? ???????. ????? ??? ????????? ?? ??????: ???????  ?????? ??????? ???????? ?? ??? ?????????.  ?? ????? ?? ????? ??? ????? ??? ??? ??????? ???? ??????? ?????? ??????? ???.  ?? ????? ???????? ?? ????? ????? ??? ???????? ???? ????? ??? ?????? ??????? ??? ???????? ??? ???? ???????? ??? ?????? ?????? ?????. ?????? ??? ???????? ?? ???? ???? ?????? ????????. ????????? ?? ??????? ?? ??? ?? ?????? ?? ??????? ???:  ????? ????? ????????? ??? ??????.  ?????? ??????? ?????? ???????? ??? ????? ?????????? ??????? ??????? ???????? ?????????? ???????.  ????? ????? ???? ??? ??????? ???? ??????? ??????. ??????? ????  ??? ???? ????? ?????? ?????? ?????? ?? ??????? ??????? ???? ?? ??? ?? ?????? ????? ????? ??? ?? ????? ?????.  ?????? ????? ???????? ??? ?? ??? ??? ?????? ????????.  ? ?? ??????? ?????? ???????? ???????? ??????? ??????? ??????? ?????? ?????? ?????? ????? ????? ??????? ???????? ?? ??????? ???????.  ?? ?????? ????? ????? ??? ?? ????? ?????? ?????? ????? ???? ?????? ?? ???? ??? ???????. ???? ???? ????? ??? ????? ?????? ???????? ?????? ??????? ?????? ??????? ?????? ????.  ?????? ????? ?????? ????????? ?????? ?? ??????? ??????? ?????? ??????? ??? ?????? ??? ????????? ??? ?? ?????? ???? ??????? ????????? ??? ???? ???? ??????? ??? ??? ???? ?? ???? ???. ????? ?????? ??????? ?????? ?? ??????? ???????:  ???? ?????? ?????? ??  ??????? ?? ???? ?? ??????? ????? ??? ?? ?? ???? ?????.  ???? ????? ??? ??????? ??? ???? ??????? ?? ????? ???? ????? ???? ?????? ??????.  ?????? ?? ?????? ??????? ?? ??????.  ?????? ?????? (???????).   ???????? ?? ?????? ???? ??????? ???? ??????????.  ?????? ????? ?? ???? ????.  ?????? ????? ?? ???????. ???? ???????? ????? ?? ??????? ???????:  ???? ??? ?? ???? ?? ????? ????? ?? ????? ???? ???? ?????.  ???? ?????? ????? ???? ??  1 ???? (2.5 ??).  ???? ???? ???????.  ?????? ?????? ????? ?????. ??? ???? ??? ??????? ????? ??? ???? ???? ?????. ????? ??? ??? 911 ?????.  ??? ?????? ?? ??? ???? ??????? ????? ?? ??.  ?? ????? ??????? ?????? ??? ????????. ??? ????? ?? ??? ????????? ?? ???? ?????? ????????? ???? ?????? ???? ??????? ??????. ???? ?? ?????? ??? ????? ???? ?? ???? ?? ???? ??????? ??????.? Document Revised: 11/23/2022 Document Reviewed: 11/23/2022 Elsevier Patient Education  2024 [employer].

## 2024-01-27 ENCOUNTER — Ambulatory Visit (INDEPENDENT_AMBULATORY_CARE_PROVIDER_SITE_OTHER)

## 2024-01-27 ENCOUNTER — Ambulatory Visit

## 2024-01-27 ENCOUNTER — Ambulatory Visit: Attending: Family Medicine

## 2024-01-28 LAB — LP+NON-HDL CHOLESTEROL
Cholesterol, Total: 199 mg/dL (ref 100–199)
HDL: 53 mg/dL (ref >39)
LDL Chol Calc (NIH): 129 mg/dL — ABNORMAL HIGH (ref 0–99)
Total Non-HDL-Chol (LDL+VLDL): 146 mg/dL — ABNORMAL HIGH (ref 0–129)
Triglycerides: 96 mg/dL (ref 0–149)
VLDL Cholesterol Cal: 17 mg/dL (ref 5–40)

## 2024-01-28 LAB — HIV ANTIBODY (ROUTINE TESTING W REFLEX): HIV Screen 4th Generation wRfx: NONREACTIVE

## 2024-01-28 LAB — HCV AB W REFLEX TO QUANT PCR: HCV Ab: NONREACTIVE

## 2024-01-28 LAB — HEPATITIS B SURFACE ANTIBODY, QUANTITATIVE: Hepatitis B Surf Ab Quant: 874 m[IU]/mL

## 2024-01-28 LAB — HCV INTERPRETATION

## 2024-05-02 ENCOUNTER — Ambulatory Visit: Attending: Family Medicine | Admitting: Nurse Practitioner

## 2024-05-02 ENCOUNTER — Encounter: Payer: Self-pay | Admitting: Nurse Practitioner

## 2024-05-02 VITALS — BP 121/84 | HR 62 | Ht 70.0 in | Wt 187.2 lb

## 2024-05-02 DIAGNOSIS — E559 Vitamin D deficiency, unspecified: Secondary | ICD-10-CM | POA: Diagnosis not present

## 2024-05-02 DIAGNOSIS — M2550 Pain in unspecified joint: Secondary | ICD-10-CM

## 2024-05-02 MED ORDER — IBUPROFEN 600 MG PO TABS: 600.0000 mg | ORAL_TABLET | Freq: Four times a day (QID) | ORAL | 0 refills | Status: AC | PRN

## 2024-05-02 NOTE — Progress Notes (Signed)
 "  Assessment & Plan:  Jacqueline Sullivan was seen today for medical management of chronic issues and pain.  Diagnoses and all orders for this visit:  Generalized joint pain -     Autoimmune Profile -     Arthritis Panel -     ibuprofen  (ADVIL ) 600 MG tablet; Take 1 tablet (600 mg total) by mouth every 6 (six) hours as needed for fever or moderate pain (pain score 4-6).  Vitamin D  deficiency disease -     VITAMIN D  25 Hydroxy (Vit-D Deficiency, Fractures)    Patient has been counseled on age-appropriate routine health concerns for screening and prevention. These are reviewed and up-to-date. Referrals have been placed accordingly. Immunizations are up-to-date or declined.    Subjective:   Chief Complaint  Patient presents with   Medical Management of Chronic Issues   Pain    Pain started in September 2025.     Jacqueline Sullivan 44 y.o. female presents to office today with concerns of generalized body pain and warmth in her feet.    VRI was used to communicate directly with patient for the entire encounter including providing detailed patient instructions.    She experiences diffuse body pain described as feeling like 'needles' in her body and a sensation of warmth in her feet. These symptoms began before her visit in September. She has tried Tylenol  and a Voltaren topical gel for pain relief, which sometimes helps, but does not want to use these medication indefinitely she states.    Four years ago, while in [place], she had high cortisol levels and was treated with medication that improved her condition, though she is unsure of the specific medication used.  Her vitamin D  levels were previously low, and she was on a weekly prescription which has not been refilled. She does not take this medication over the counter.   Has GYN appt next week. Recommend hormone testing.    Review of Systems  Constitutional:  Positive for malaise/fatigue. Negative for fever and weight loss.  HENT:  Negative.  Negative for nosebleeds.   Eyes: Negative.  Negative for blurred vision, double vision and photophobia.  Respiratory: Negative.  Negative for cough and shortness of breath.   Cardiovascular: Negative.  Negative for chest pain, palpitations and leg swelling.  Gastrointestinal: Negative.  Negative for heartburn, nausea and vomiting.  Musculoskeletal:  Positive for joint pain and myalgias.  Skin:  Positive for rash (above yes and cheeks).  Neurological: Negative.  Negative for dizziness, focal weakness, seizures and headaches.  Psychiatric/Behavioral: Negative.  Negative for suicidal ideas.     Past Medical History:  Diagnosis Date   Gastroesophageal reflux disease without esophagitis 10/22/2020    No past surgical history on file.  Family History  Problem Relation Age of Onset   Hypertension Mother     Social History Reviewed with no changes to be made today.   Outpatient Medications Prior to Visit  Medication Sig Dispense Refill   atorvastatin  (LIPITOR) 10 MG tablet Take 1 tablet (10 mg total) by mouth daily. 90 tablet 3   benzonatate  (TESSALON ) 100 MG capsule Take 1 capsule (100 mg total) by mouth 3 (three) times daily as needed for cough. (Patient not taking: Reported on 05/02/2024) 21 capsule 0   cetirizine  (ZYRTEC ) 10 MG tablet Take 1 tablet (10 mg total) by mouth daily. (Patient not taking: Reported on 05/02/2024) 30 tablet 1   fluticasone  (FLONASE ) 50 MCG/ACT nasal spray Place 2 sprays into both nostrils daily. (Patient not  taking: Reported on 05/02/2024) 16 g 6   Vitamin D , Ergocalciferol , (DRISDOL ) 1.25 MG (50000 UNIT) CAPS capsule Take 1 capsule (50,000 Units total) by mouth every 7 (seven) days. (Patient not taking: Reported on 05/02/2024) 16 capsule 0   ibuprofen  (ADVIL ) 600 MG tablet Take 1 tablet (600 mg total) by mouth every 6 (six) hours as needed for fever or moderate pain. (Patient not taking: Reported on 05/02/2024) 30 tablet 0   olopatadine  (PATANOL) 0.1 %  ophthalmic solution Place 1 drop into the left eye 2 (two) times daily. (Patient not taking: Reported on 05/02/2024) 5 mL 2   No facility-administered medications prior to visit.    Allergies[1]     Objective:    BP 121/84 (BP Location: Left Arm, Patient Position: Sitting, Cuff Size: Normal)   Pulse 62   Ht 5' 10 (1.778 m)   Wt 187 lb 3.2 oz (84.9 kg)   SpO2 100%   BMI 26.86 kg/m  Wt Readings from Last 3 Encounters:  05/02/24 187 lb 3.2 oz (84.9 kg)  01/10/24 190 lb (86.2 kg)  07/08/23 193 lb 3.2 oz (87.6 kg)    Physical Exam Vitals and nursing note reviewed.  Constitutional:      Appearance: She is well-developed.  HENT:     Head: Normocephalic and atraumatic.  Cardiovascular:     Rate and Rhythm: Normal rate and regular rhythm.     Heart sounds: Normal heart sounds. No murmur heard.    No friction rub. No gallop.  Pulmonary:     Effort: Pulmonary effort is normal. No tachypnea or respiratory distress.     Breath sounds: Normal breath sounds. No decreased breath sounds, wheezing, rhonchi or rales.  Chest:     Chest wall: No tenderness.  Musculoskeletal:        General: Normal range of motion.     Cervical back: Normal range of motion.  Skin:    General: Skin is warm and dry.  Neurological:     Mental Status: She is alert and oriented to person, place, and time.     Coordination: Coordination normal.  Psychiatric:        Behavior: Behavior normal. Behavior is cooperative.        Thought Content: Thought content normal.        Judgment: Judgment normal.          Patient has been counseled extensively about nutrition and exercise as well as the importance of adherence with medications and regular follow-up. The patient was given clear instructions to go to ER or return to medical center if symptoms don't improve, worsen or new problems develop. The patient verbalized understanding.   Follow-up: Return if symptoms worsen or fail to improve.   Haze LELON Servant,  FNP-BC [facility] [place], [place] [phone]   05/02/2024, 11:03 AM      [1] No Known Allergies  "

## 2024-05-02 NOTE — Patient Instructions (Signed)
 Vitamin D 5000 units daily

## 2024-05-03 ENCOUNTER — Ambulatory Visit: Payer: Self-pay | Admitting: Nurse Practitioner

## 2024-05-03 DIAGNOSIS — E559 Vitamin D deficiency, unspecified: Secondary | ICD-10-CM

## 2024-05-03 LAB — VITAMIN D 25 HYDROXY (VIT D DEFICIENCY, FRACTURES): Vit D, 25-Hydroxy: 20.6 ng/mL — ABNORMAL LOW (ref 30.0–100.0)

## 2024-05-03 LAB — ARTHRITIS PANEL
Anti Nuclear Antibody (ANA): NEGATIVE
Rheumatoid fact SerPl-aCnc: <10 [IU]/mL
Sed Rate: 14 mm/h (ref 0–32)
Uric Acid: 4.6 mg/dL (ref 2.6–6.2)

## 2024-05-03 LAB — AUTOIMMUNE PROFILE
Complement C3, Serum: 139 mg/dL (ref 82–167)
dsDNA Ab: 6 [IU]/mL (ref 0–9)

## 2024-05-03 MED ORDER — VITAMIN D (ERGOCALCIFEROL) 1.25 MG (50000 UNIT) PO CAPS: 50000.0000 [IU] | ORAL_CAPSULE | ORAL | 0 refills | Status: AC

## 2024-05-15 ENCOUNTER — Other Ambulatory Visit: Payer: Self-pay | Admitting: Obstetrics & Gynecology

## 2024-05-15 ENCOUNTER — Ambulatory Visit: Admitting: Obstetrics & Gynecology

## 2024-05-15 ENCOUNTER — Encounter: Payer: Self-pay | Admitting: Obstetrics & Gynecology

## 2024-05-15 VITALS — BP 128/83 | HR 63 | Ht 70.0 in | Wt 187.0 lb

## 2024-05-15 DIAGNOSIS — Z01419 Encounter for gynecological examination (general) (routine) without abnormal findings: Secondary | ICD-10-CM

## 2024-05-15 DIAGNOSIS — N951 Menopausal and female climacteric states: Secondary | ICD-10-CM | POA: Diagnosis not present

## 2024-05-15 DIAGNOSIS — Z1231 Encounter for screening mammogram for malignant neoplasm of breast: Secondary | ICD-10-CM | POA: Diagnosis not present

## 2024-05-15 DIAGNOSIS — Z1211 Encounter for screening for malignant neoplasm of colon: Secondary | ICD-10-CM | POA: Diagnosis not present

## 2024-05-15 NOTE — Progress Notes (Signed)
 GYNECOLOGY CLINIC ANNUAL PREVENTATIVE CARE ENCOUNTER NOTE  Subjective:   Jacqueline Sullivan is a 44 y.o. [phone] female here for a routine annual gynecologic exam.  Current complaints: menses are irregular sometimes. She would like to conceive and she has a miscarriage this last year and did not get any care.   Denies abnormal vaginal bleeding, discharge, pelvic pain, problems with intercourse or other gynecologic concerns.    Gynecologic History Patient's last menstrual period was 04/18/2024 (approximate). Contraception: none Last Pap: 10 years. Results were: normal Last mammogram: none.   Obstetric History OB History  Gravida Para Term Preterm AB Living  4 4 4   4   SAB IAB Ectopic Multiple Live Births      4    # Outcome Date GA Lbr Len/2nd Weight Sex Type Anes PTL Lv  4 Term 2016 [redacted]w[redacted]d   M Vag-Spont None N LIV  3 Term 2012 [redacted]w[redacted]d   M Vag-Spont None N LIV  2 Term 2009 [redacted]w[redacted]d   M Vag-Spont None N LIV  1 Term 2006 [redacted]w[redacted]d   M Vag-Spont None N LIV    Past Medical History:  Diagnosis Date   Gastroesophageal reflux disease without esophagitis 10/22/2020    History reviewed. No pertinent surgical history.  Medications Ordered Prior to Encounter[1]  Allergies[2]  Social History   Socioeconomic History   Marital status: Married    Spouse name: Not on file   Number of children: Not on file   Years of education: Not on file   Highest education level: Not on file  Occupational History   Not on file  Tobacco Use   Smoking status: Never   Smokeless tobacco: Never  Substance and Sexual Activity   Alcohol use: Never   Drug use: Never   Sexual activity: Yes  Other Topics Concern   Not on file  Social History Narrative   Not on file   Social Drivers of Health   Tobacco Use: Low Risk (05/15/2024)   Patient History    Smoking Tobacco Use: Never    Smokeless Tobacco Use: Never    Passive Exposure: Not on file  Financial Resource Strain: Not on file  Food Insecurity: Low  Risk (08/03/2023)   Received from [facility]   Epic    Within the past 12 months, you worried that your food would run out before you got money to buy more: Never true    Within the past 12 months, the food you bought just didn't last and you didn't have money to get more. : Never true  Transportation Needs: No Transportation Needs (08/03/2023)   Received from [facility]    In the past 12 months, has lack of reliable transportation kept you from medical appointments, meetings, work or from getting things needed for daily living? : No  Physical Activity: Not on file  Stress: Not on file  Social Connections: Not on file  Intimate Partner Violence: Not on file  Depression (PHQ2-9): Low Risk (05/15/2024)   Depression (PHQ2-9)    PHQ-2 Score: 0  Alcohol Screen: Not on file  Housing: Low Risk (08/03/2023)   Received from [facility]   Epic    What is your living situation today?: I have a steady place to live    Think about the place you live. Do you have problems with any of the following? Choose all that apply:: None/None on this list  Utilities: Low Risk (08/03/2023)   Received from [facility]  Utilities    In the past 12 months has the electric, gas, oil, or water company threatened to shut off services in your home? : No  Health Literacy: Not on file    Family History  Problem Relation Age of Onset   Hypertension Mother     The following portions of the patient's history were reviewed and updated as appropriate: allergies, current medications, past family history, past medical history, past social history, past surgical history and problem list.  Review of Systems Cardiovascular: negative Gastrointestinal: negative Genitourinary:negative   Objective:  BP 128/83 (BP Location: Right Arm, Patient Position: Sitting, Cuff Size: Large)   Pulse 63   Ht 5' 10 (1.778 m)   Wt 187 lb (84.8 kg)   LMP 04/18/2024 (Approximate)   BMI 26.83 kg/m  CONSTITUTIONAL:  Well-developed, well-nourished female in no acute distress.  HENT:  Normocephalic, atraumatic, External right and left ear normal. Oropharynx is clear and moist EYES: Conjunctivae and EOM are normal. Pupils are equal, round, and reactive to light. No scleral icterus.  NECK: Normal range of motion, supple, no masses.  Normal thyroid.  SKIN: Skin is warm and dry. No rash noted. Not diaphoretic. No erythema. No pallor. NEUROLGIC: Alert and oriented to person, place, and time. Normal reflexes, muscle tone coordination. No cranial nerve deficit noted. PSYCHIATRIC: Normal mood and affect. Normal behavior. Normal judgment and thought content. CARDIOVASCULAR: Normal heart rate noted, regular rhythm RESPIRATORY: Clear to auscultation bilaterally. Effort and breath sounds normal, no problems with respiration noted. BREASTS: Symmetric in size. No masses, skin changes, nipple drainage, or lymphadenopathy. ABDOMEN: Soft, normal bowel sounds, no distention noted.  No tenderness, rebound or guarding.  PELVIC: declined MUSCULOSKELETAL: Normal range of motion. No tenderness.  No cyanosis, clubbing, or edema.    Assessment:  Annual gynecologic examination with pap smear   Plan:  Patient deferred pap and pelvic today and I offered reschedule FSH to help assess fertility Mammogram scheduled Routine preventative health maintenance measures emphasized. Please refer to After Visit Summary for other counseling recommendations.    LYNWOOD SOLOMONS, MD Attending Obstetrician & Gynecologist [facility], [facility]     [1]  Current Outpatient Medications on File Prior to Visit  Medication Sig Dispense Refill   atorvastatin  (LIPITOR) 10 MG tablet Take 1 tablet (10 mg total) by mouth daily. 90 tablet 3   ibuprofen  (ADVIL ) 600 MG tablet Take 1 tablet (600 mg total) by mouth every 6 (six) hours as needed for fever or moderate pain (pain score 4-6). 60 tablet 0   Vitamin D ,  Ergocalciferol , (DRISDOL ) 1.25 MG (50000 UNIT) CAPS capsule Take 1 capsule (50,000 Units total) by mouth every 7 (seven) days. 16 capsule 0   benzonatate  (TESSALON ) 100 MG capsule Take 1 capsule (100 mg total) by mouth 3 (three) times daily as needed for cough. (Patient not taking: Reported on 05/15/2024) 21 capsule 0   cetirizine  (ZYRTEC ) 10 MG tablet Take 1 tablet (10 mg total) by mouth daily. (Patient not taking: Reported on 05/15/2024) 30 tablet 1   fluticasone  (FLONASE ) 50 MCG/ACT nasal spray Place 2 sprays into both nostrils daily. (Patient not taking: Reported on 05/15/2024) 16 g 6   No current facility-administered medications on file prior to visit.  [2] No Known Allergies

## 2024-05-15 NOTE — Progress Notes (Signed)
 CAP interpreter Bedor.

## 2024-05-23 ENCOUNTER — Ambulatory Visit: Payer: Self-pay | Admitting: Obstetrics & Gynecology

## 2024-05-23 LAB — REPROSURE
ANTI-MULLERIAN HORMONE (AMH): <0.02 ng/mL
Estradiol, Serum, MS: 5 pg/mL
Follicle Stimulating Hormone: 92.8 m[IU]/mL

## 2024-05-23 LAB — REPROSURE PATIENT EDUCATION

## 2024-05-27 ENCOUNTER — Ambulatory Visit (HOSPITAL_BASED_OUTPATIENT_CLINIC_OR_DEPARTMENT_OTHER)

## 2024-07-05 ENCOUNTER — Ambulatory Visit: Admitting: Obstetrics & Gynecology
